# Patient Record
Sex: Female | Born: 1980 | Race: White | Hispanic: No | Marital: Married | Smoking: Former smoker
Health system: Southern US, Community
[De-identification: ages and names within clinical notes are randomized; demographics above are authoritative.]

## PROBLEM LIST (undated history)

## (undated) DIAGNOSIS — K219 Gastro-esophageal reflux disease without esophagitis: Secondary | ICD-10-CM

## (undated) DIAGNOSIS — Z1589 Genetic susceptibility to other disease: Secondary | ICD-10-CM

## (undated) HISTORY — PX: WISDOM TOOTH EXTRACTION: SHX21

## (undated) HISTORY — DX: Gastro-esophageal reflux disease without esophagitis: K21.9

---
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
## 2002-01-26 ENCOUNTER — Other Ambulatory Visit: Admission: RE | Admit: 2002-01-26 | Discharge: 2002-01-26 | Payer: Self-pay | Admitting: Obstetrics and Gynecology

## 2002-01-26 ENCOUNTER — Other Ambulatory Visit: Admission: RE | Admit: 2002-01-26 | Discharge: 2002-01-26 | Payer: Self-pay | Admitting: Obstetrics & Gynecology

## 2003-08-23 ENCOUNTER — Other Ambulatory Visit: Admission: RE | Admit: 2003-08-23 | Discharge: 2003-08-23 | Payer: Self-pay | Admitting: Obstetrics and Gynecology

## 2005-03-17 ENCOUNTER — Encounter: Admission: RE | Admit: 2005-03-17 | Discharge: 2005-03-17 | Payer: Self-pay | Admitting: Family Medicine

## 2005-07-16 ENCOUNTER — Other Ambulatory Visit: Admission: RE | Admit: 2005-07-16 | Discharge: 2005-07-16 | Payer: Self-pay | Admitting: Family Medicine

## 2006-06-04 ENCOUNTER — Encounter: Admission: RE | Admit: 2006-06-04 | Discharge: 2006-06-04 | Payer: Self-pay | Admitting: *Deleted

## 2007-12-18 ENCOUNTER — Inpatient Hospital Stay (HOSPITAL_COMMUNITY): Admission: AD | Admit: 2007-12-18 | Discharge: 2007-12-18 | Payer: Self-pay | Admitting: Obstetrics & Gynecology

## 2007-12-20 ENCOUNTER — Inpatient Hospital Stay (HOSPITAL_COMMUNITY): Admission: AD | Admit: 2007-12-20 | Discharge: 2007-12-20 | Payer: Self-pay | Admitting: Obstetrics and Gynecology

## 2007-12-21 ENCOUNTER — Inpatient Hospital Stay (HOSPITAL_COMMUNITY): Admission: AD | Admit: 2007-12-21 | Discharge: 2007-12-21 | Payer: Self-pay | Admitting: Obstetrics and Gynecology

## 2007-12-22 ENCOUNTER — Inpatient Hospital Stay (HOSPITAL_COMMUNITY): Admission: AD | Admit: 2007-12-22 | Discharge: 2007-12-25 | Payer: Self-pay | Admitting: Obstetrics & Gynecology

## 2008-02-01 IMAGING — US US ABDOMEN COMPLETE
1 series · 14 of 25 positions shown · non-contrast
Comparison: None.

ABDOMEN ULTRASOUND:

CLINICAL DATA: Nausea with right upper quadrant pain.
TECHNIQUE: Complete abdominal ultrasound examination was performed including
evaluation of the liver, gallbladder, bile ducts, pancreas, kidneys, spleen,
IVC, and abdominal aorta.

[Series 1: us abdomen complete · 0.26mm/px · 14 of 99 slices shown]
[im 1/99]
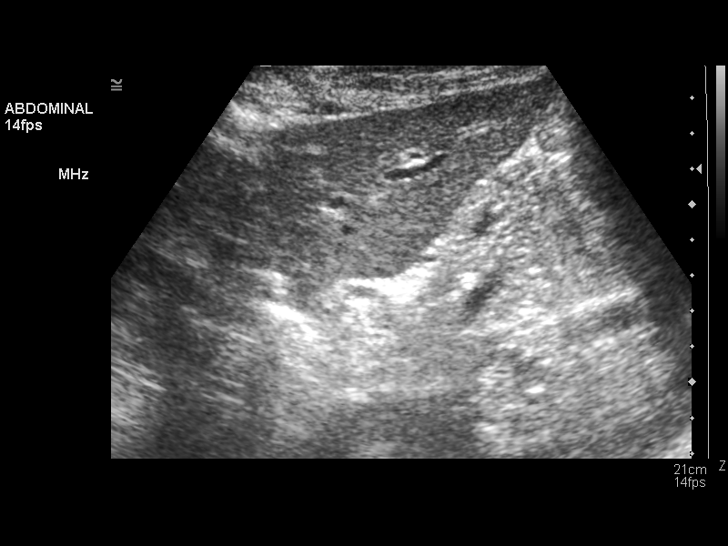
[im 9/99]
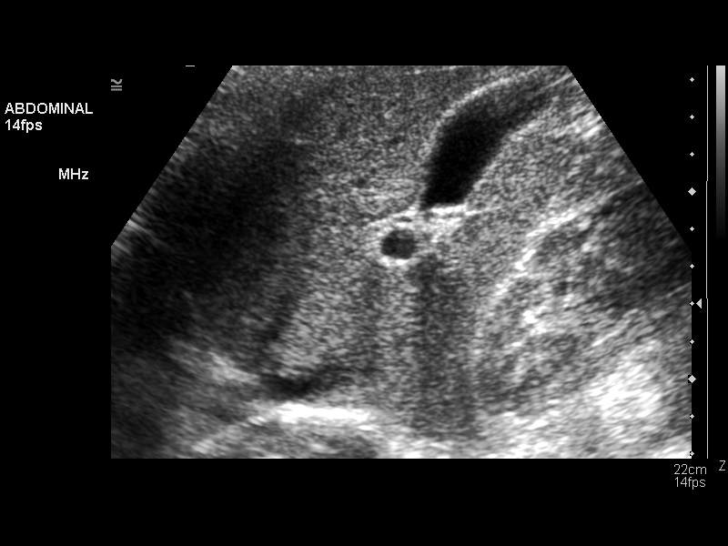
[im 17/99]
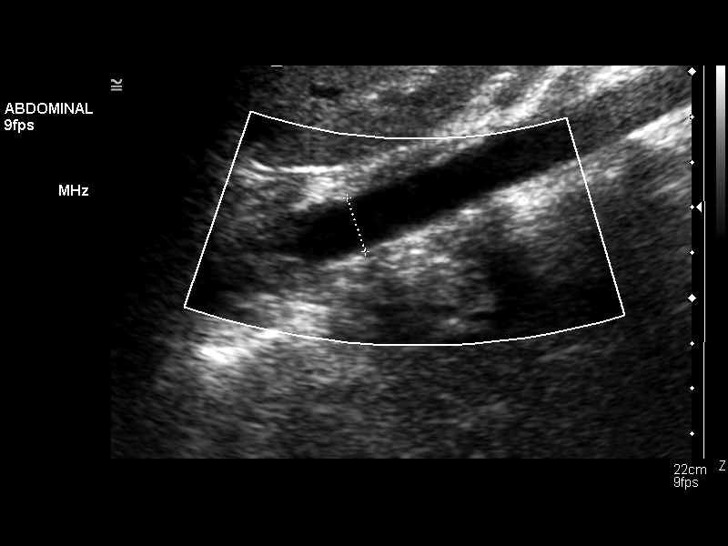
[im 25/99]
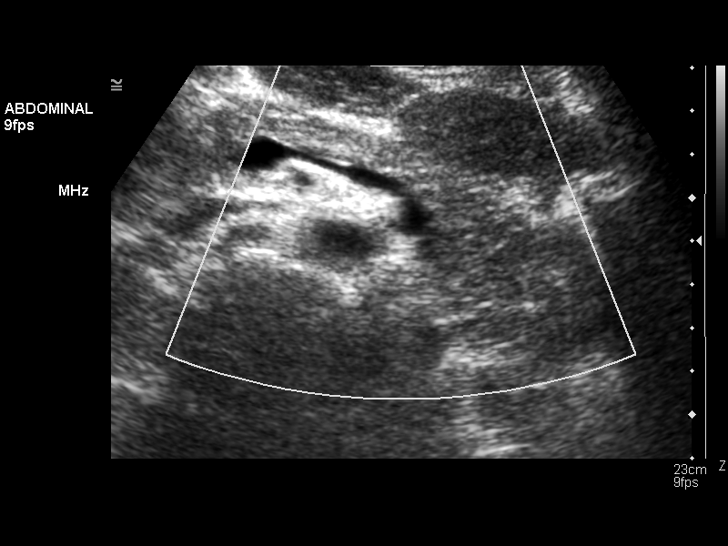
[im 33/99]
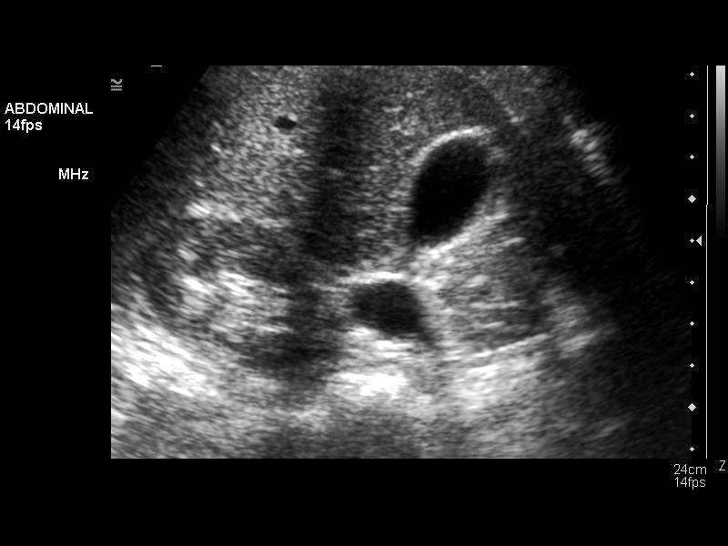
[im 37/99]
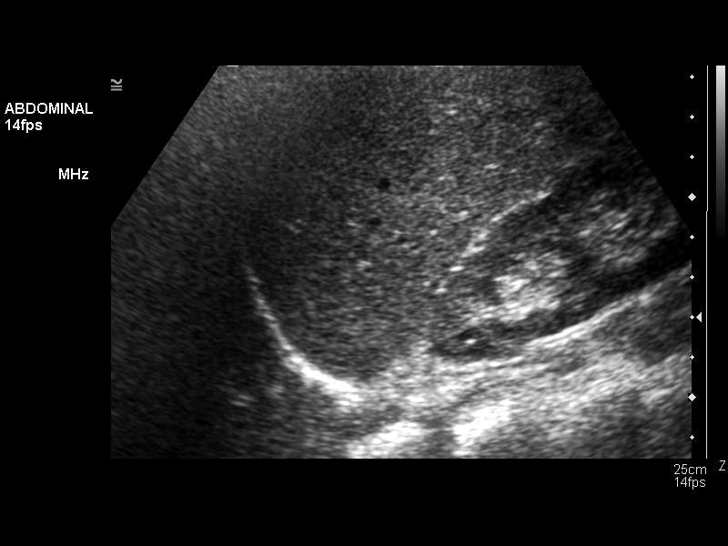
[im 45/99]
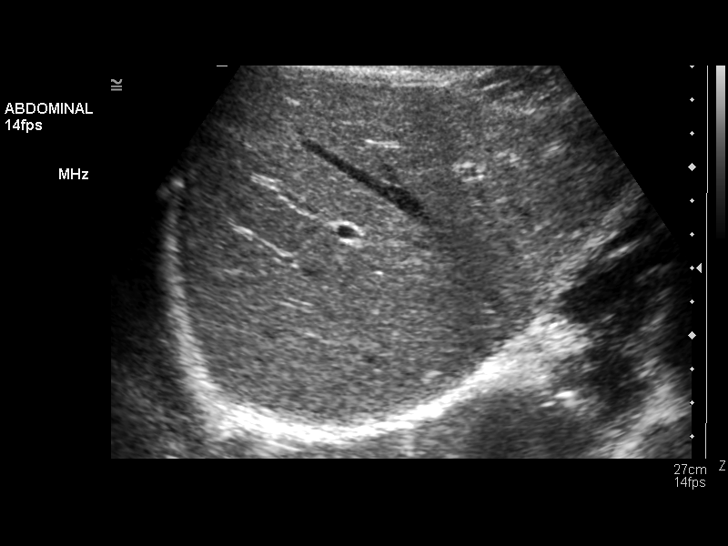
[im 54/99]
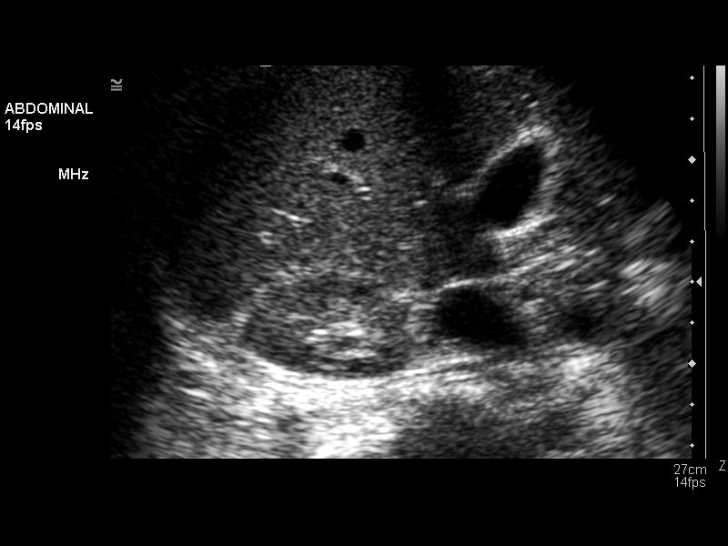
[im 62/99]
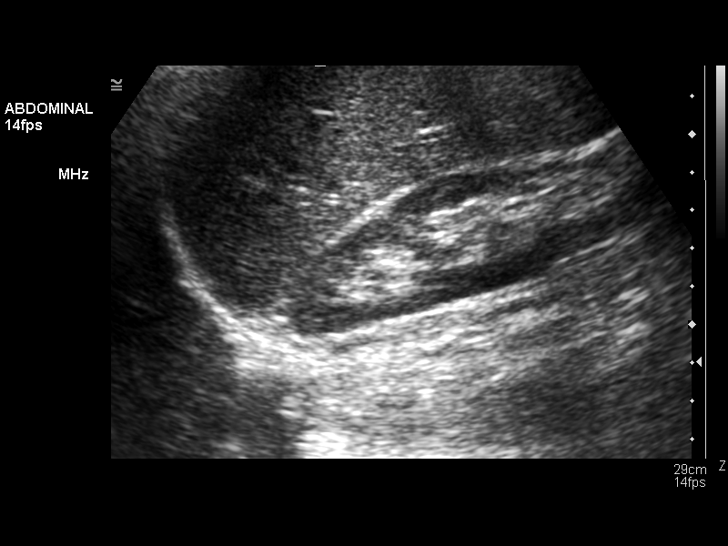
[im 66/99]
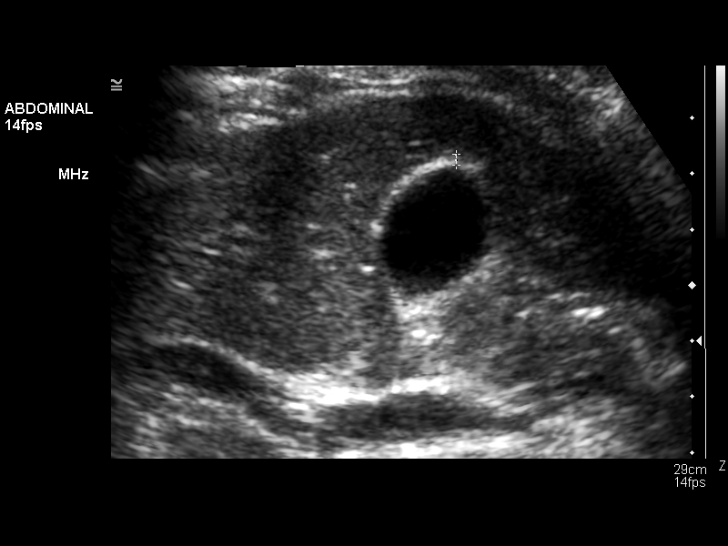
[im 74/99]
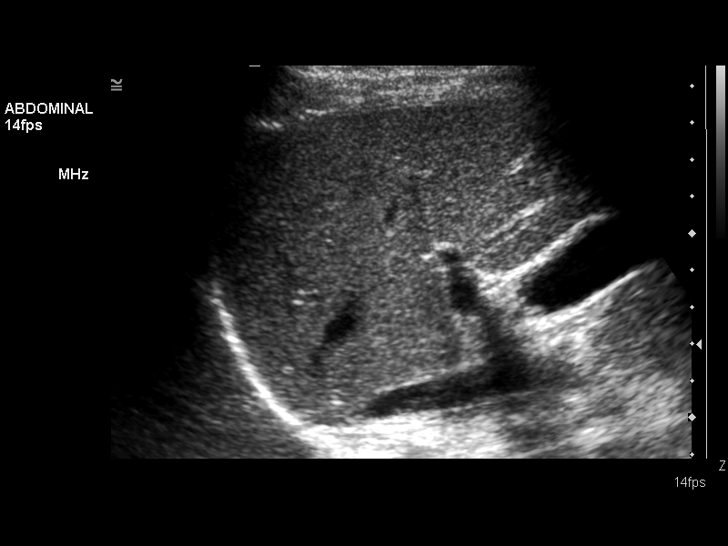
[im 82/99]
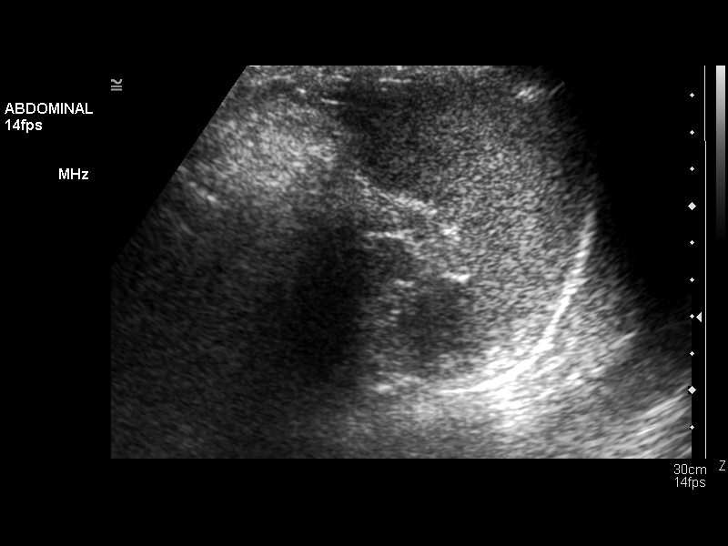
[im 90/99]
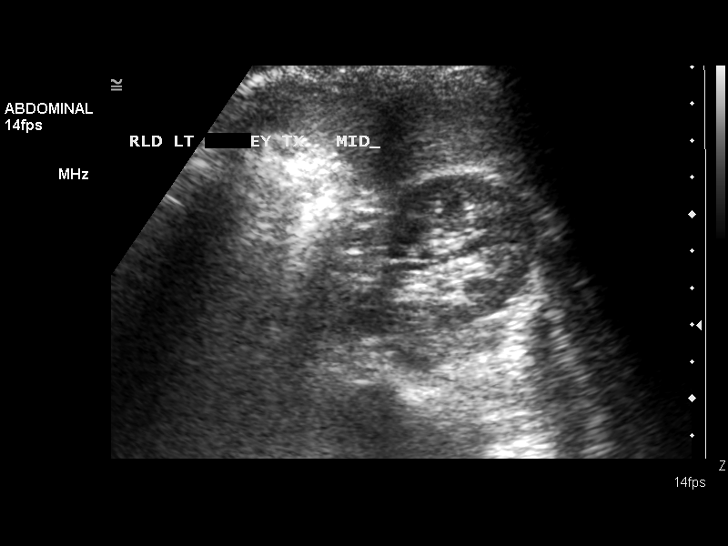
[im 99/99]
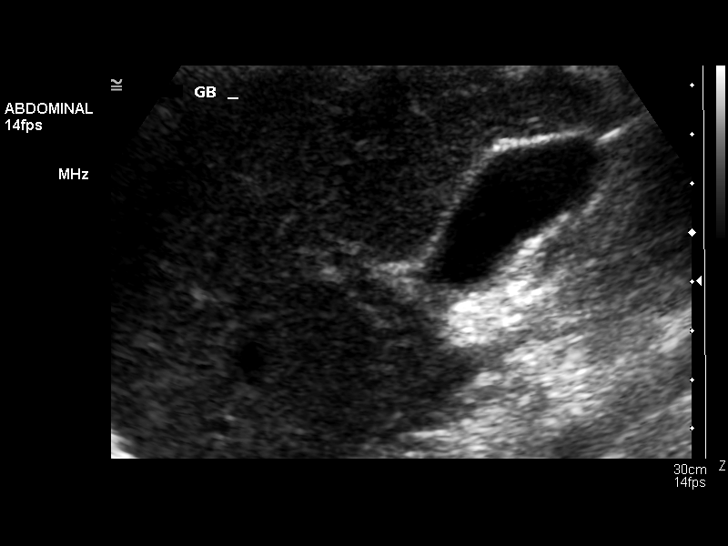

[14 of 25 positions shown; findings below may reference images not displayed]

FINDINGS: Gallbladder: Normal. Specifically, there is no evidence for gallstones,
gallbladder wall thickening or pericholecystic fluid.

Common Bile Duct:  Nondilated

Liver:  Normal

Inferior Vena Cava:  Normal

Pancreas:  Normal

Spleen:  Normal

Right Kidney:  11.4 cm in long axis.  Normal

Left Kidney:  9.8 cm in long axis.  Normal

Aorta:  No aneurysm
IMPRESSION: Normal abdominal ultrasound.

## 2010-05-01 ENCOUNTER — Inpatient Hospital Stay (HOSPITAL_COMMUNITY): Admission: AD | Admit: 2010-05-01 | Payer: Self-pay | Admitting: Obstetrics and Gynecology

## 2010-05-16 ENCOUNTER — Inpatient Hospital Stay (HOSPITAL_COMMUNITY)
Admission: RE | Admit: 2010-05-16 | Discharge: 2010-05-19 | Payer: Self-pay | Source: Home / Self Care | Attending: Obstetrics and Gynecology | Admitting: Obstetrics and Gynecology

## 2010-08-11 LAB — CBC
HCT: 28.7 % — ABNORMAL LOW (ref 36.0–46.0)
Hemoglobin: 9.5 g/dL — ABNORMAL LOW (ref 12.0–15.0)
MCH: 28.4 pg (ref 26.0–34.0)
MCHC: 33.1 g/dL (ref 30.0–36.0)
MCV: 85.7 fL (ref 78.0–100.0)
Platelets: 225 10*3/uL (ref 150–400)
RBC: 3.35 MIL/uL — ABNORMAL LOW (ref 3.87–5.11)
RDW: 13.6 % (ref 11.5–15.5)
WBC: 8.8 10*3/uL (ref 4.0–10.5)

## 2010-08-11 LAB — ABO/RH: ABO/RH(D): B POS

## 2010-08-11 LAB — TYPE AND SCREEN
ABO/RH(D): B POS
Antibody Screen: NEGATIVE

## 2010-08-12 LAB — CBC
HCT: 33.1 % — ABNORMAL LOW (ref 36.0–46.0)
Hemoglobin: 11.1 g/dL — ABNORMAL LOW (ref 12.0–15.0)
MCH: 29.9 pg (ref 26.0–34.0)
MCHC: 33.7 g/dL (ref 30.0–36.0)
MCV: 88.8 fL (ref 78.0–100.0)
Platelets: 313 10*3/uL (ref 150–400)
RBC: 3.73 MIL/uL — ABNORMAL LOW (ref 3.87–5.11)
RDW: 13.7 % (ref 11.5–15.5)
WBC: 7.6 10*3/uL (ref 4.0–10.5)

## 2010-08-12 LAB — RPR: RPR Ser Ql: NONREACTIVE

## 2010-08-12 LAB — SURGICAL PCR SCREEN
MRSA, PCR: NEGATIVE
Staphylococcus aureus: NEGATIVE

## 2011-02-27 LAB — CBC
HCT: 31 — ABNORMAL LOW
HCT: 36.7
Hemoglobin: 10.3 — ABNORMAL LOW
Hemoglobin: 12.4
MCHC: 33.4
MCHC: 33.7
MCV: 88.6
MCV: 89.2
Platelets: 255
Platelets: 379
RBC: 3.47 — ABNORMAL LOW
RBC: 4.14
RDW: 14.7
RDW: 14.8
WBC: 12 — ABNORMAL HIGH
WBC: 16.5 — ABNORMAL HIGH

## 2011-02-27 LAB — HEPATIC FUNCTION PANEL
ALT: 13
AST: 26
Albumin: 2.7 — ABNORMAL LOW
Alkaline Phosphatase: 136 — ABNORMAL HIGH
Bilirubin, Direct: 0.1
Indirect Bilirubin: 0.4
Total Bilirubin: 0.5
Total Protein: 6

## 2011-02-27 LAB — RPR: RPR Ser Ql: NONREACTIVE

## 2014-02-28 ENCOUNTER — Other Ambulatory Visit: Payer: Self-pay | Admitting: Obstetrics and Gynecology

## 2014-03-02 ENCOUNTER — Other Ambulatory Visit: Payer: Self-pay | Admitting: Obstetrics and Gynecology

## 2014-03-02 DIAGNOSIS — N6321 Unspecified lump in the left breast, upper outer quadrant: Secondary | ICD-10-CM

## 2014-03-09 ENCOUNTER — Ambulatory Visit
Admission: RE | Admit: 2014-03-09 | Discharge: 2014-03-09 | Disposition: A | Discharge status: Home / Self Care | Payer: Commercial Indemnity | Source: Ambulatory Visit | Attending: Obstetrics and Gynecology | Admitting: Obstetrics and Gynecology

## 2014-03-09 ENCOUNTER — Encounter (INDEPENDENT_AMBULATORY_CARE_PROVIDER_SITE_OTHER): Payer: Self-pay

## 2014-03-09 ENCOUNTER — Other Ambulatory Visit: Payer: Self-pay

## 2014-03-09 DIAGNOSIS — N6321 Unspecified lump in the left breast, upper outer quadrant: Secondary | ICD-10-CM

## 2016-09-18 LAB — BASIC METABOLIC PANEL
BUN: 10 (ref 4–21)
Creatinine: 0.8 (ref <=1.1)
Glucose: 77
Potassium: 3.9 (ref 3.4–5.3)
Sodium: 138 (ref 137–147)

## 2016-09-18 LAB — CBC AND DIFFERENTIAL
HCT: 39 (ref 36–46)
Hemoglobin: 13.4 (ref 12.0–16.0)
WBC: 5.3

## 2016-09-18 LAB — TSH: TSH: 1.93 (ref <=5.90)

## 2016-09-18 LAB — HEPATIC FUNCTION PANEL
ALT: 8 (ref 7–35)
AST: 12 — AB (ref 13–35)

## 2016-09-18 LAB — LIPID PANEL
Cholesterol: 152 (ref 0–200)
HDL: 51 (ref 35–70)
LDL Cholesterol: 101
Triglycerides: 47 (ref 40–160)

## 2016-09-24 ENCOUNTER — Telehealth: Payer: Self-pay | Admitting: Cardiovascular Disease

## 2016-09-24 NOTE — Telephone Encounter (Signed)
Received records from [facility] for appointment with Dr Duke Salvia on 10/09/16.  Records put with Dr Leonides Sake schedule for 10/09/16. lp

## 2016-10-09 ENCOUNTER — Ambulatory Visit: Payer: Commercial Indemnity | Admitting: Cardiovascular Disease

## 2017-10-11 ENCOUNTER — Ambulatory Visit (INDEPENDENT_AMBULATORY_CARE_PROVIDER_SITE_OTHER): Payer: Managed Care, Other (non HMO) | Admitting: Family Medicine

## 2017-10-11 ENCOUNTER — Encounter: Payer: Self-pay | Admitting: Family Medicine

## 2017-10-11 ENCOUNTER — Other Ambulatory Visit: Payer: Self-pay

## 2017-10-11 VITALS — BP 100/68 | HR 65 | Temp 97.6°F | Ht <= 58 in | Wt 104.0 lb

## 2017-10-11 DIAGNOSIS — R05 Cough: Secondary | ICD-10-CM

## 2017-10-11 DIAGNOSIS — J3089 Other allergic rhinitis: Secondary | ICD-10-CM

## 2017-10-11 DIAGNOSIS — R5383 Other fatigue: Secondary | ICD-10-CM | POA: Diagnosis not present

## 2017-10-11 DIAGNOSIS — Z72820 Sleep deprivation: Secondary | ICD-10-CM | POA: Diagnosis not present

## 2017-10-11 DIAGNOSIS — R053 Chronic cough: Secondary | ICD-10-CM

## 2017-10-11 NOTE — Progress Notes (Signed)
Subjective  CC:  Chief Complaint  Patient presents with  . Establish Care    Transfer from [place], [place], Physical at [place] last year, Sees Gyn   . Cough    Patient states she had a cough and has to clear her throat throughout the day   . Fatigue    Patient states she is tired all the time.     HPI: Victoria Mcintosh is a 37 y.o. female who presents to [facility] today to establish care with me as a new patient.   She has the following concerns or needs:  Chronic cough x 1-2 years. Never has had medical evaluation before. Wants to be sure she is ok. Reports daily increased clearing of her throat, some nasal congestion, some eye redness with itching and at time productive cough. occ hoarseness. No f/c/s or h/o asthma/copd or lung disease. No specific exposures although she reports a mold problem in her bathroom. Hasn't tried any meds. Prefer homeopathic remedies if possible.   Reports h/o sxs of gerd but none now. No n/v or abdominal pain  C/o fatigue; worsening. Always tired. Admits staying up too late and only getting aobut 6 hours of sleep /night; needs about 8. No insomnia. No sxs of low thyroid. Had hgb at GYN office last week and thinks it was normal. No h/o anemia. Having irregular cycles. No birth control. No h/o thyroid problems. Denies sxs of hyperglycemia. Nutrition is fair. Little exercise. Of note, she is seeing a grief counselor; her dad passed away in 07/23/22.   We updated and reviewed the patient's past history in detail and it is documented below.  There are no active problems to display for this patient.  Health Maintenance  Topic Date Due  . INFLUENZA VACCINE  12/30/2017  . TETANUS/TDAP  05/01/2020  . PAP SMEAR  10/05/2020  . HIV Screening  Completed    There is no immunization history on file for this patient. Current Meds  Medication Sig  . Ascorbic Acid (VITAMIN C) 250 MG CHEW Chew by mouth.  Marland Kitchen MAGNESIUM CARBONATE PO Take  by mouth.  . Multiple Vitamins-Minerals (MULTIVITAMIN WITH MINERALS) tablet Take 1 tablet by mouth daily.    Allergies: Patient has No Known Allergies. Past Medical History Patient  has no past medical history on file. Past Surgical History Patient  has a past surgical history that includes Cesarean section. Family History: Patient family history includes Healthy in her daughter and son. Social History:  Patient  reports that she quit smoking about 18 years ago. Her smoking use included cigarettes. She has a 0.50 pack-year smoking history. She has never used smokeless tobacco. She reports that she does not drink alcohol or use drugs.  Review of Systems: Constitutional: negative for fever or malaise Ophthalmic: negative for photophobia, double vision or loss of vision Cardiovascular: negative for chest pain, dyspnea on exertion, or new LE swelling Respiratory: negative for SOB or persistent cough Gastrointestinal: negative for abdominal pain, change in bowel habits or melena Genitourinary: negative for dysuria or gross hematuria Musculoskeletal: negative for new gait disturbance or muscular weakness Integumentary: negative for new or persistent rashes Neurological: negative for TIA or stroke symptoms Psychiatric: negative for SI or delusions Allergic/Immunologic: negative for hives  Patient Care Team    Relationship Specialty Notifications Start End  Willow Ora, MD PCP - General Family Medicine  10/11/17   Zelphia Cairo, MD Consulting Physician Obstetrics and Gynecology  10/11/17  Grant Fontana, MD Referring Physician Ophthalmology  10/11/17     Objective  Vitals: BP 100/68   Pulse 65   Temp 97.6 F (36.4 C)   Ht  (1.473 m)   Wt 104 lb (47.2 kg)   LMP 10/11/2017 (Exact Date)   BMI 21.74 kg/m  General:  Well developed, well nourished, no acute distress  Psych:  Alert and oriented,normal mood and affect HEENT:  Normocephalic, atraumatic, non-icteric sclera,  PERRL, oropharynx is without mass or exudate, supple neck without adenopathy, mass or thyromegaly Cardiovascular:  RRR without gallop, rub or murmur, nondisplaced PMI Respiratory:  Good breath sounds bilaterally, CTAB with normal respiratory effort Gastrointestinal: normal bowel sounds, soft, non-tender, no noted masses. No HSM MSK: no deformities, contusions. Joints are without erythema or swelling Skin:  Warm, no rashes or suspicious lesions noted Neurologic:    Mental status is normal. Gross motor and sensory exams are normal. Normal gait  Assessment  1. Chronic cough   2. Non-seasonal allergic rhinitis, unspecified trigger   3. Other fatigue   4. Sleep deprivation      Plan   Chronic cough: discussed most likely cause is AR; consider GERD. To start oral antihistamine trial and/or flonase. Recheck in 2-4 weeks and start further evaluation if persisting. Discussed working on mold issue in home as this can exacerbate allergies. No red flags identified right now.   Fatigue: likely related to sleep deprivation and allergies; will work on getting more rest, good nutrition and return for f/u. Will get labs at f/u to ensure no other cause.   Follow up:  2-4 weeks for recheck and cpe.   Commons side effects, risks, benefits, and alternatives for medications and treatment plan prescribed today were discussed, and the patient expressed understanding of the given instructions. Patient is instructed to call or message via MyChart if he/she has any questions or concerns regarding our treatment plan. No barriers to understanding were identified. We discussed Red Flag symptoms and signs in detail. Patient expressed understanding regarding what to do in case of urgent or emergency type symptoms.   Medication list was reconciled, printed and provided to the patient in AVS. Patient instructions and summary information was reviewed with the patient as documented in the AVS. This note was prepared with  assistance of Dragon voice recognition software. Occasional wrong-word or sound-a-like substitutions may have occurred due to the inherent limitations of voice recognition software  No orders of the defined types were placed in this encounter.  No orders of the defined types were placed in this encounter.

## 2017-10-11 NOTE — Patient Instructions (Signed)
Please return in 2-4 weeks for for your annual complete physical; please come fasting.  Start zyrtec  nightly to see if your cough and fatigue improve. You can also try flonase nasal spray daily if you'd like.   Shoot for getting 8 hours of sleep per night.   It was a pleasure meeting you today! Thank you for choosing [place] to meet your healthcare needs! I truly look forward to working with you. If you have any questions or concerns, please send me a message via Mychart or call the office at [phone].

## 2017-10-19 ENCOUNTER — Encounter: Payer: Self-pay | Admitting: Emergency Medicine

## 2017-11-03 ENCOUNTER — Ambulatory Visit (INDEPENDENT_AMBULATORY_CARE_PROVIDER_SITE_OTHER): Payer: Managed Care, Other (non HMO) | Admitting: Family Medicine

## 2017-11-03 ENCOUNTER — Other Ambulatory Visit: Payer: Self-pay

## 2017-11-03 ENCOUNTER — Encounter: Payer: Self-pay | Admitting: Family Medicine

## 2017-11-03 VITALS — BP 110/78 | HR 73 | Temp 97.9°F | Resp 16 | Ht <= 58 in | Wt 104.0 lb

## 2017-11-03 DIAGNOSIS — Z0000 Encounter for general adult medical examination without abnormal findings: Secondary | ICD-10-CM | POA: Diagnosis not present

## 2017-11-03 DIAGNOSIS — R05 Cough: Secondary | ICD-10-CM

## 2017-11-03 DIAGNOSIS — R053 Chronic cough: Secondary | ICD-10-CM

## 2017-11-03 LAB — CBC WITH DIFFERENTIAL/PLATELET
Basophils Absolute: 0 10*3/uL (ref 0.0–0.1)
Basophils Relative: 0.9 % (ref 0.0–3.0)
Eosinophils Absolute: 0.1 10*3/uL (ref 0.0–0.7)
Eosinophils Relative: 2.4 % (ref 0.0–5.0)
HCT: 39.3 % (ref 36.0–46.0)
Hemoglobin: 13.6 g/dL (ref 12.0–15.0)
Lymphocytes Relative: 30.9 % (ref 12.0–46.0)
Lymphs Abs: 1.7 10*3/uL (ref 0.7–4.0)
MCHC: 34.5 g/dL (ref 30.0–36.0)
MCV: 90.9 fl (ref 78.0–100.0)
Monocytes Absolute: 0.5 10*3/uL (ref 0.1–1.0)
Monocytes Relative: 8.3 % (ref 3.0–12.0)
Neutro Abs: 3.2 10*3/uL (ref 1.4–7.7)
Neutrophils Relative %: 57.5 % (ref 43.0–77.0)
Platelets: 283 10*3/uL (ref 150.0–400.0)
RBC: 4.32 Mil/uL (ref 3.87–5.11)
RDW: 12.7 % (ref 11.5–15.5)
WBC: 5.6 10*3/uL (ref 4.0–10.5)

## 2017-11-03 LAB — COMPREHENSIVE METABOLIC PANEL
ALT: 12 U/L (ref 0–35)
AST: 13 U/L (ref 0–37)
Albumin: 4.5 g/dL (ref 3.5–5.2)
Alkaline Phosphatase: 27 U/L — ABNORMAL LOW (ref 39–117)
BUN: 11 mg/dL (ref 6–23)
CO2: 27 mEq/L (ref 19–32)
Calcium: 9.4 mg/dL (ref 8.4–10.5)
Chloride: 103 mEq/L (ref 96–112)
Creatinine, Ser: 0.8 mg/dL (ref 0.40–1.20)
GFR: 85.54 mL/min (ref >60.00)
Glucose, Bld: 90 mg/dL (ref 70–99)
Potassium: 4 mEq/L (ref 3.5–5.1)
Sodium: 138 mEq/L (ref 135–145)
Total Bilirubin: 0.8 mg/dL (ref 0.2–1.2)
Total Protein: 6.7 g/dL (ref 6.0–8.3)

## 2017-11-03 LAB — LIPID PANEL
Cholesterol: 157 mg/dL (ref 0–200)
HDL: 58.3 mg/dL (ref >39.00)
LDL Cholesterol: 85 mg/dL (ref 0–99)
NonHDL: 98.59
Total CHOL/HDL Ratio: 3
Triglycerides: 68 mg/dL (ref 0.0–149.0)
VLDL: 13.6 mg/dL (ref 0.0–40.0)

## 2017-11-03 LAB — VITAMIN D 25 HYDROXY (VIT D DEFICIENCY, FRACTURES): VITD: 41.94 ng/mL (ref 30.00–100.00)

## 2017-11-03 LAB — TSH: TSH: 3.5 u[IU]/mL (ref 0.35–4.50)

## 2017-11-03 MED ORDER — FLUTICASONE PROPIONATE 50 MCG/ACT NA SUSP: 1.0000 | Freq: Every day | NASAL | 5 refills | Status: DC

## 2017-11-03 NOTE — Progress Notes (Signed)
Subjective  Chief Complaint  Patient presents with  . Annual Exam    Last CPE 1-2 years ago, wants a lipid panel and vit D checked, CMP, CBC, Diff, Plt    HPI: Victoria Mcintosh is a 37 y.o. female who presents to [facility] today for a Female Wellness Visit. Had GYN female wellness exam last month.  Wellness Visit: annual visit with health maintenance review and exam without Pap   37 yo healthy. See last note: had chronic cough most likely due to AR: started Zyrtec and is no longer coughing. Hasn't tried flonase. Still with some nasal congestion and PND but this doesn't bother her much. She does endorse intermittent mouth breathing. Still with some fatigue but not going to bed till 12:30am on most nights. Of note, her father, Jeani Hawking, who passed in February was my former patient. She found my name on one of his RX bottles.   HM: up to date.  Lifestyle: Body mass index is 22.12 kg/m. Wt Readings from Last 3 Encounters:  11/03/17 104 lb (47.2 kg)  10/11/17 104 lb (47.2 kg)   Diet: general Exercise: intermittently,  Need for contraception: No,   Patient Active Problem List   Diagnosis Date Noted  . Chronic cough 10/11/2017   Health Maintenance  Topic Date Due  . INFLUENZA VACCINE  12/30/2017  . TETANUS/TDAP  05/01/2020  . PAP SMEAR  10/05/2020  . HIV Screening  Completed    There is no immunization history on file for this patient. We updated and reviewed the patient's past history in detail and it is documented below. Allergies: Patient has No Known Allergies. Past Medical History Patient  has a past medical history of GERD (gastroesophageal reflux disease). Past Surgical History Patient  has a past surgical history that includes Cesarean section. Family History: Patient family history includes Arthritis in her maternal grandmother; Cancer in her paternal grandfather; Diabetes in her maternal grandmother; Healthy in her daughter; Heart  attack in her father; Heart disease in her father; Hyperlipidemia in her father and mother; Hypertension in her father and mother; Learning disabilities in her son; Miscarriages / [place] in her mother. Social History:  Patient  reports that she quit smoking about 18 years ago. Her smoking use included cigarettes. She has a 0.50 pack-year smoking history. She has never used smokeless tobacco. She reports that she does not drink alcohol or use drugs.  Review of Systems: Constitutional: negative for fever or malaise Ophthalmic: negative for photophobia, double vision or loss of vision Cardiovascular: negative for chest pain, dyspnea on exertion, or new LE swelling Respiratory: negative for SOB or persistent cough Gastrointestinal: negative for abdominal pain, change in bowel habits or melena Genitourinary: negative for dysuria or gross hematuria, no abnormal uterine bleeding or disharge Musculoskeletal: negative for new gait disturbance or muscular weakness Integumentary: negative for new or persistent rashes, no breast lumps Neurological: negative for TIA or stroke symptoms Psychiatric: negative for SI or delusions Allergic/Immunologic: negative for hives Patient Care Team    Relationship Specialty Notifications Start End  Willow Ora, MD PCP - General Family Medicine  10/11/17   Zelphia Cairo, MD Consulting Physician Obstetrics and Gynecology  10/11/17   Grant Fontana, MD Referring Physician Ophthalmology  10/11/17     Objective  Vitals: BP 110/78   Pulse 73   Temp 97.9 F (36.6 C) (Oral)   Resp 16   Ht 4' 9.5" (1.461 m)   Wt 104 lb (47.2  kg)   LMP 10/11/2017 (Exact Date)   SpO2 99%   BMI 22.12 kg/m  General:  Well developed, well nourished, no acute distress  Psych:  Alert and orientedx3,normal mood and affect HEENT:  Normocephalic, atraumatic, non-icteric sclera, PERRL, oropharynx is clear without mass or exudate, + PND, nasal mucosa with inflammation and congestion,  clear; supple neck without adenopathy, mass or thyromegaly Cardiovascular:  Normal S1, S2, RRR without gallop, rub or murmur, nondisplaced PMI Respiratory:  Good breath sounds bilaterally, CTAB with normal respiratory effort Gastrointestinal: normal bowel sounds, soft, non-tender, no noted masses. No HSM MSK: no deformities, contusions. Joints are without erythema or swelling. Spine and CVA region are nontender Skin:  Warm, no rashes or suspicious lesions noted Neurologic:    Mental status is normal. CN 2-11 are normal. Gross motor and sensory exams are normal. Normal gait. No tremor    Assessment  1. Annual physical exam   2. Chronic cough      Plan  Female Wellness Visit:  Age appropriate Health Maintenance and Prevention measures were discussed with patient. Included topics are cancer screening recommendations, ways to keep healthy (see AVS) including dietary and exercise recommendations, regular eye and dental care, use of seat belts, and avoidance of moderate alcohol use and tobacco use.   BMI: discussed patient's BMI and encouraged positive lifestyle modifications to help get to or maintain a target BMI.  HM needs and immunizations were addressed and ordered. See below for orders. See HM and immunization section for updates.  Routine labs and screening tests ordered including cmp, cbc and lipids where appropriate.  Discussed recommendations regarding Vit D and calcium supplementation (see AVS)  AR: continue zyrtec and add flonase  Follow up: Return in about 1 year (around 11/04/2018) for complete physical.    Commons side effects, risks, benefits, and alternatives for medications and treatment plan prescribed today were discussed, and the patient expressed understanding of the given instructions. Patient is instructed to call or message via MyChart if he/she has any questions or concerns regarding our treatment plan. No barriers to understanding were identified. We discussed Red  Flag symptoms and signs in detail. Patient expressed understanding regarding what to do in case of urgent or emergency type symptoms.   Medication list was reconciled, printed and provided to the patient in AVS. Patient instructions and summary information was reviewed with the patient as documented in the AVS. This note was prepared with assistance of Dragon voice recognition software. Occasional wrong-word or sound-a-like substitutions may have occurred due to the inherent limitations of voice recognition software  Orders Placed This Encounter  Procedures  . TSH  . CBC with Differential/Platelet  . Comprehensive metabolic panel  . Lipid panel  . VITAMIN D 25 Hydroxy (Vit-D Deficiency, Fractures)   Meds ordered this encounter  Medications  . fluticasone (FLONASE) 50 MCG/ACT nasal spray    Sig: Place 1 spray into both nostrils daily.    Dispense:  16 g    Refill:  5

## 2017-11-03 NOTE — Patient Instructions (Addendum)
Please return in 1 year for your physical.    If you have any questions or concerns, please don't hesitate to send me a message via MyChart or call the office at [phone]. Thank you for visiting with us today! It's our pleasure caring for you.  I will release your lab results to you on your MyChart account with further instructions. Please reply with any questions.  Please do these things to maintain good health!   Exercise at least 30-45 minutes a day,  4-5 days a week.   Eat a low-fat diet with lots of fruits and vegetables, up to 7-9 servings per day.  Drink plenty of water daily. Try to drink 8 8oz glasses per day.  Seatbelts can save your life. Always wear your seatbelt.  Place Smoke Detectors on every level of your home and check batteries every year.  Schedule an appointment with an eye doctor for an eye exam every 1-2 years  Safe sex - use condoms to protect yourself from STDs if you could be exposed to these types of infections. Use birth control if you do not want to become pregnant and are sexually active.  Avoid heavy alcohol use. If you drink, keep it to less than 2 drinks/day and not every day.  Health Care Power of Attorney.  Choose someone you trust that could speak for you if you became unable to speak for yourself.  Depression is common in our stressful world.If you're feeling down or losing interest in things you normally enjoy, please come in for a visit.  If anyone is threatening or hurting you, please get help. Physical or Emotional Violence is never OK.

## 2017-11-04 ENCOUNTER — Encounter: Payer: Self-pay | Admitting: Family Medicine

## 2017-11-04 NOTE — Progress Notes (Signed)
Lab results mailed to patient in letter. Normal results. No action / follow up needed on these results.  

## 2017-11-04 NOTE — Progress Notes (Signed)
I have reviewed results. Normal. Patient notified by letter. Please see letter for details. 

## 2018-11-07 ENCOUNTER — Ambulatory Visit (INDEPENDENT_AMBULATORY_CARE_PROVIDER_SITE_OTHER): Payer: Managed Care, Other (non HMO) | Admitting: Family Medicine

## 2018-11-07 ENCOUNTER — Other Ambulatory Visit: Payer: Self-pay

## 2018-11-07 ENCOUNTER — Encounter: Payer: Self-pay | Admitting: Family Medicine

## 2018-11-07 VITALS — BP 114/72 | HR 83 | Temp 98.3°F | Resp 14 | Ht <= 58 in | Wt 110.2 lb

## 2018-11-07 DIAGNOSIS — R202 Paresthesia of skin: Secondary | ICD-10-CM

## 2018-11-07 DIAGNOSIS — J309 Allergic rhinitis, unspecified: Secondary | ICD-10-CM | POA: Insufficient documentation

## 2018-11-07 DIAGNOSIS — Z0000 Encounter for general adult medical examination without abnormal findings: Secondary | ICD-10-CM

## 2018-11-07 DIAGNOSIS — F419 Anxiety disorder, unspecified: Secondary | ICD-10-CM | POA: Diagnosis not present

## 2018-11-07 DIAGNOSIS — N6011 Diffuse cystic mastopathy of right breast: Secondary | ICD-10-CM

## 2018-11-07 DIAGNOSIS — N6012 Diffuse cystic mastopathy of left breast: Secondary | ICD-10-CM

## 2018-11-07 LAB — TSH: TSH: 2.54 u[IU]/mL (ref 0.35–4.50)

## 2018-11-07 LAB — CBC WITH DIFFERENTIAL/PLATELET
Basophils Absolute: 0.1 10*3/uL (ref 0.0–0.1)
Basophils Relative: 1 % (ref 0.0–3.0)
Eosinophils Absolute: 0.3 10*3/uL (ref 0.0–0.7)
Eosinophils Relative: 4.3 % (ref 0.0–5.0)
HCT: 41.3 % (ref 36.0–46.0)
Hemoglobin: 14.2 g/dL (ref 12.0–15.0)
Lymphocytes Relative: 27.2 % (ref 12.0–46.0)
Lymphs Abs: 1.6 10*3/uL (ref 0.7–4.0)
MCHC: 34.4 g/dL (ref 30.0–36.0)
MCV: 92.2 fl (ref 78.0–100.0)
Monocytes Absolute: 0.5 10*3/uL (ref 0.1–1.0)
Monocytes Relative: 8.7 % (ref 3.0–12.0)
Neutro Abs: 3.5 10*3/uL (ref 1.4–7.7)
Neutrophils Relative %: 58.8 % (ref 43.0–77.0)
Platelets: 312 10*3/uL (ref 150.0–400.0)
RBC: 4.48 Mil/uL (ref 3.87–5.11)
RDW: 12.1 % (ref 11.5–15.5)
WBC: 6 10*3/uL (ref 4.0–10.5)

## 2018-11-07 LAB — LIPID PANEL
Cholesterol: 161 mg/dL (ref 0–200)
HDL: 60.2 mg/dL (ref >39.00)
LDL Cholesterol: 81 mg/dL (ref 0–99)
NonHDL: 101.24
Total CHOL/HDL Ratio: 3
Triglycerides: 101 mg/dL (ref 0.0–149.0)
VLDL: 20.2 mg/dL (ref 0.0–40.0)

## 2018-11-07 LAB — COMPREHENSIVE METABOLIC PANEL
ALT: 11 U/L (ref 0–35)
AST: 13 U/L (ref 0–37)
Albumin: 4.5 g/dL (ref 3.5–5.2)
Alkaline Phosphatase: 32 U/L — ABNORMAL LOW (ref 39–117)
BUN: 11 mg/dL (ref 6–23)
CO2: 28 mEq/L (ref 19–32)
Calcium: 9.2 mg/dL (ref 8.4–10.5)
Chloride: 103 mEq/L (ref 96–112)
Creatinine, Ser: 0.81 mg/dL (ref 0.40–1.20)
GFR: 78.91 mL/min (ref >60.00)
Glucose, Bld: 72 mg/dL (ref 70–99)
Potassium: 3.6 mEq/L (ref 3.5–5.1)
Sodium: 138 mEq/L (ref 135–145)
Total Bilirubin: 0.5 mg/dL (ref 0.2–1.2)
Total Protein: 6.7 g/dL (ref 6.0–8.3)

## 2018-11-07 LAB — B12 AND FOLATE PANEL
Folate: >23.9 ng/mL (ref >5.9)
Vitamin B-12: 385 pg/mL (ref 211–911)

## 2018-11-07 MED ORDER — FLUTICASONE PROPIONATE 50 MCG/ACT NA SUSP: 2.0000 | Freq: Every day | NASAL | 6 refills | Status: DC

## 2018-11-07 NOTE — Progress Notes (Signed)
Subjective  Chief Complaint  Patient presents with  . Annual Exam    Not fasting  . Breast Pain    Left side feels achy, has some pain not sure if related to heart but on left side  . Numbness    Bilateral feet started about a month ago    HPI: Victoria Mcintosh is a 38 y.o. female who presents to [facility] [place] today for a Female Wellness Visit.  She also has the concerns and/or needs as listed above in the chief complaint. These will be addressed in addition to the Health Maintenance Visit.   Wellness Visit: annual visit with health maintenance review and exam without Pap   HM: pap smear up to date. Healthy lifestyle  Chronic disease management visit and/or acute problem visit:  C/o bilateral breast tenderness on and off for several months now. No lumps or masses. No nipple discharge. Regular menses. No birth control (would be fine with another child).   Left chest pressure with tingling in hands ands feet at times. Feels tired. Endorses increase anxiety and worry. No panic attacks. Reports typically is an uptight person. No h/o mood problems or GAD. Sleeps well. Normal appetite.   Allergies: persist. Tried zyrtec but prefers to not take medications so stopped. Reports nasal congestion that is bothersome. No wheeze. No eye sxs.   [facility] 11/07/2018 10/11/2017  Decreased Interest 0 0  Down, Depressed, Hopeless 0 0  PHQ - 2 Score 0 0  Altered sleeping - 0  Tired, decreased energy - 0  Change in appetite - 0  Feeling bad or failure about yourself  - 0  Trouble concentrating - 0  Moving slowly or fidgety/restless - 0  Suicidal thoughts - 0  PHQ-9 Score - 0  Difficult doing work/chores - Not difficult at all    Assessment  1. Annual physical exam   2. Paresthesia   3. Anxiety   4. Chronic allergic rhinitis   5. Fibrocystic breast changes of both breasts      Plan  Female Wellness Visit:  Age appropriate Health Maintenance and  Prevention measures were discussed with patient. Included topics are cancer screening recommendations, ways to keep healthy (see AVS) including dietary and exercise recommendations, regular eye and dental care, use of seat belts, and avoidance of moderate alcohol use and tobacco use.   BMI: discussed patient's BMI and encouraged positive lifestyle modifications to help get to or maintain a target BMI.  HM needs and immunizations were addressed and ordered. See below for orders. See HM and immunization section for updates.  Routine labs and screening tests ordered including cmp, cbc and lipids where appropriate.  Discussed recommendations regarding Vit D and calcium supplementation (see AVS)  Chronic disease f/u and/or acute problem visit: (deemed necessary to be done in addition to the wellness visit):  anxiety:  sxs most c/w anxiety. Education started. See avs for information and behavioral mgt. Recheck in 3-4 weeks and rec SSRI at that time if not improving. Check labs for anemia, or vitamin deficiency as well. Reassured. No sxs of CAD currently. Low risk pt  AR: rec flonase trial.   Fibrocystic breast changes. Educated. Start vit E bid.   Follow up: Return in 4 weeks (on 12/05/2018) for  recheck anxiety.   Orders Placed This Encounter  Procedures  . CBC with Differential  . Comprehensive metabolic panel  . Lipid panel  . TSH  . B12 and Folate Panel  Meds ordered this encounter  Medications  . fluticasone (FLONASE) 50 MCG/ACT nasal spray    Sig: Place 2 sprays into both nostrils daily.    Dispense:  16 g    Refill:  6      Lifestyle: Body mass index is 23.43 kg/m. Wt Readings from Last 3 Encounters:  11/07/18 110 lb 3.2 oz (50 kg)  11/03/17 104 lb (47.2 kg)  10/11/17 104 lb (47.2 kg)   Diet: general Exercise: intermittently,  Need for contraception: No, none  Patient Active Problem List   Diagnosis Date Noted  . Chronic allergic rhinitis 11/07/2018  . Chronic  cough 10/11/2017   Health Maintenance  Topic Date Due  . INFLUENZA VACCINE  12/31/2018  . TETANUS/TDAP  05/01/2020  . PAP SMEAR-Modifier  10/05/2020  . HIV Screening  Completed    There is no immunization history on file for this patient. We updated and reviewed the patient's past history in detail and it is documented below. Allergies: Patient  reports no history of alcohol use. Past Medical History Patient  has a past medical history of GERD (gastroesophageal reflux disease). Past Surgical History Patient  has a past surgical history that includes Cesarean section. Social History   Socioeconomic History  . Marital status: Married    Spouse name: Not on file  . Number of children: 2  . Years of education: Not on file  . Highest education level: Not on file  Occupational History  . Occupation: [employer]; home schools  Social Needs  . Financial resource strain: Not on file  . Food insecurity:    Worry: Not on file    Inability: Not on file  . Transportation needs:    Medical: Not on file    Non-medical: Not on file  Tobacco Use  . Smoking status: Former Smoker    Packs/day: 0.25    Years: 2.00    Pack years: 0.50    Types: Cigarettes    Last attempt to quit: 03/1999    Years since quitting: 19.6  . Smokeless tobacco: Never Used  Substance and Sexual Activity  . Alcohol use: Never    Frequency: Never  . Drug use: Never  . Sexual activity: Yes    Birth control/protection: None  Lifestyle  . Physical activity:    Days per week: Not on file    Minutes per session: Not on file  . Stress: Not on file  Relationships  . Social connections:    Talks on phone: Not on file    Gets together: Not on file    Attends religious service: Not on file    Active member of club or organization: Not on file    Attends meetings of clubs or organizations: Not on file    Relationship status: Not on file  Other Topics Concern  . Not on file  Social History Narrative  .  Not on file   Family History  Problem Relation Age of Onset  . Hyperlipidemia Mother   . Hypertension Mother   . Miscarriages / [place] Mother   . Heart attack Father   . Heart disease Father   . Hyperlipidemia Father   . Hypertension Father   . Healthy Daughter   . Learning disabilities Son   . Arthritis Maternal Grandmother   . Diabetes Maternal Grandmother   . Cancer Paternal Grandfather     Review of Systems: Constitutional: negative for fever or malaise Ophthalmic: negative for photophobia, double vision or loss  of vision Cardiovascular: negative for chest pain, dyspnea on exertion, or new LE swelling Respiratory: negative for SOB or persistent cough Gastrointestinal: negative for abdominal pain, change in bowel habits or melena Genitourinary: negative for dysuria or gross hematuria, no abnormal uterine bleeding or disharge Musculoskeletal: negative for new gait disturbance or muscular weakness Integumentary: negative for new or persistent rashes, no breast lumps Neurological: negative for TIA or stroke symptoms Psychiatric: negative for SI or delusions Allergic/Immunologic: negative for hives  Patient Care Team    Relationship Specialty Notifications Start End  Leamon Arnt, MD PCP - General Family Medicine  10/11/17   Marylynn Pearson, MD Consulting Physician Obstetrics and Gynecology  10/11/17   Konrad Felix, MD Referring Physician Ophthalmology  10/11/17     Objective  Vitals: BP 114/72   Pulse 83   Temp 98.3 F (36.8 C) (Oral)   Resp 14   Ht 4' 9.5" (1.461 m)   Wt 110 lb 3.2 oz (50 kg)   LMP 10/21/2018   SpO2 99%   BMI 23.43 kg/m  General:  Well developed, well nourished, no acute distress  Psych:  Alert and orientedx3,normal mood and affect HEENT:  Normocephalic, atraumatic, non-icteric sclera, PERRL, oropharynx is clear without mass or exudate, supple neck without adenopathy, mass or thyromegaly Cardiovascular:  Normal S1, S2, RRR without gallop,  rub or murmur, nondisplaced PMI Respiratory:  Good breath sounds bilaterally, CTAB with normal respiratory effort Gastrointestinal: normal bowel sounds, soft, non-tender, no noted masses. No HSM MSK: no deformities, contusions. Joints are without erythema or swelling. Spine and CVA region are nontender Skin:  Warm, no rashes or suspicious lesions noted Neurologic:    Mental status is normal. CN 2-11 are normal. Gross motor and sensory exams are normal. Normal gait. No tremor Breast Exam: No mass, skin retraction or nipple discharge is appreciated in either breast. No axillary adenopathy. Fibrocystic changes are noted     Commons side effects, risks, benefits, and alternatives for medications and treatment plan prescribed today were discussed, and the patient expressed understanding of the given instructions. Patient is instructed to call or message via MyChart if he/she has any questions or concerns regarding our treatment plan. No barriers to understanding were identified. We discussed Red Flag symptoms and signs in detail. Patient expressed understanding regarding what to do in case of urgent or emergency type symptoms.   Medication list was reconciled, printed and provided to the patient in AVS. Patient instructions and summary information was reviewed with the patient as documented in the AVS. This note was prepared with assistance of Dragon voice recognition software. Occasional wrong-word or sound-a-like substitutions may have occurred due to the inherent limitations of voice recognition software

## 2018-11-07 NOTE — Patient Instructions (Addendum)
Please return in 4 weeks to discuss stress and anxiety.    If you have any questions or concerns, please don't hesitate to send me a message via MyChart or call the office at [phone]. Thank you for visiting with [place] today! It's our pleasure caring for you.   Health Maintenance, Female Adopting a healthy lifestyle and getting preventive care can go a long way to promote health and wellness. Talk with your health care provider about what schedule of regular examinations is right for you. This is a good chance for you to check in with your provider about disease prevention and staying healthy. In between checkups, there are plenty of things you can do on your own. Experts have done a lot of research about which lifestyle changes and preventive measures are most likely to keep you healthy. Ask your health care provider for more information. Weight and diet Eat a healthy diet  Be sure to include plenty of vegetables, fruits, low-fat dairy products, and lean protein.  Do not eat a lot of foods high in solid fats, added sugars, or salt.  Get regular exercise. This is one of the most important things you can do for your health. ? Most adults should exercise for at least 150 minutes each week. The exercise should increase your heart rate and make you sweat (moderate-intensity exercise). ? Most adults should also do strengthening exercises at least twice a week. This is in addition to the moderate-intensity exercise. Maintain a healthy weight  Body mass index (BMI) is a measurement that can be used to identify possible weight problems. It estimates body fat based on height and weight. Your health care provider can help determine your BMI and help you achieve or maintain a healthy weight.  For females 22 years of age and older: ? A BMI below 18.5 is considered underweight. ? A BMI of 18.5 to 24.9 is normal. ? A BMI of 25 to 29.9 is considered overweight. ? A BMI of 30 and above is considered  obese. Watch levels of cholesterol and blood lipids  You should start having your blood tested for lipids and cholesterol at 39 years of age, then have this test every 5 years.  You may need to have your cholesterol levels checked more often if: ? Your lipid or cholesterol levels are high. ? You are older than 39 years of age. ? You are at high risk for heart disease. Cancer screening Lung Cancer  Lung cancer screening is recommended for adults 77-10 years old who are at high risk for lung cancer because of a history of smoking.  A yearly low-dose CT scan of the lungs is recommended for people who: ? Currently smoke. ? Have quit within the past 15 years. ? Have at least a 30-pack-year history of smoking. A pack year is smoking an average of one pack of cigarettes a day for 1 year.  Yearly screening should continue until it has been 15 years since you quit.  Yearly screening should stop if you develop a health problem that would prevent you from having lung cancer treatment. Breast Cancer  Practice breast self-awareness. This means understanding how your breasts normally appear and feel.  It also means doing regular breast self-exams. Let your health care provider know about any changes, no matter how small.  If you are in your 20s or 30s, you should have a clinical breast exam (CBE) by a health care provider every 1-3 years as part of a regular health exam.  If you are 40 or older, have a CBE every year. Also consider having a breast X-ray (mammogram) every year.  If you have a family history of breast cancer, talk to your health care provider about genetic screening.  If you are at high risk for breast cancer, talk to your health care provider about having an MRI and a mammogram every year.  Breast cancer gene (BRCA) assessment is recommended for women who have family members with BRCA-related cancers. BRCA-related cancers include: ? Breast. ? Ovarian. ? Tubal. ? Peritoneal  cancers.  Results of the assessment will determine the need for genetic counseling and BRCA1 and BRCA2 testing. Cervical Cancer Your health care provider may recommend that you be screened regularly for cancer of the pelvic organs (ovaries, uterus, and vagina). This screening involves a pelvic examination, including checking for microscopic changes to the surface of your cervix (Pap test). You may be encouraged to have this screening done every 3 years, beginning at age 21.  For women ages 30-65, health care providers may recommend pelvic exams and Pap testing every 3 years, or they may recommend the Pap and pelvic exam, combined with testing for human papilloma virus (HPV), every 5 years. Some types of HPV increase your risk of cervical cancer. Testing for HPV may also be done on women of any age with unclear Pap test results.  Other health care providers may not recommend any screening for nonpregnant women who are considered low risk for pelvic cancer and who do not have symptoms. Ask your health care provider if a screening pelvic exam is right for you.  If you have had past treatment for cervical cancer or a condition that could lead to cancer, you need Pap tests and screening for cancer for at least 20 years after your treatment. If Pap tests have been discontinued, your risk factors (such as having a new sexual partner) need to be reassessed to determine if screening should resume. Some women have medical problems that increase the chance of getting cervical cancer. In these cases, your health care provider may recommend more frequent screening and Pap tests. Colorectal Cancer  This type of cancer can be detected and often prevented.  Routine colorectal cancer screening usually begins at 39 years of age and continues through 39 years of age.  Your health care provider may recommend screening at an earlier age if you have risk factors for colon cancer.  Your health care provider may also  recommend using home test kits to check for hidden blood in the stool.  A small camera at the end of a tube can be used to examine your colon directly (sigmoidoscopy or colonoscopy). This is done to check for the earliest forms of colorectal cancer.  Routine screening usually begins at age 50.  Direct examination of the colon should be repeated every 5-10 years through 39 years of age. However, you may need to be screened more often if early forms of precancerous polyps or small growths are found. Skin Cancer  Check your skin from head to toe regularly.  Tell your health care provider about any new moles or changes in moles, especially if there is a change in a mole's shape or color.  Also tell your health care provider if you have a mole that is larger than the size of a pencil eraser.  Always use sunscreen. Apply sunscreen liberally and repeatedly throughout the day.  Protect yourself by wearing long sleeves, pants, a wide-brimmed hat, and sunglasses whenever you   are outside. Heart disease, diabetes, and high blood pressure  High blood pressure causes heart disease and increases the risk of stroke. High blood pressure is more likely to develop in: ? People who have blood pressure in the high end of the normal range (130-139/85-89 mm Hg). ? People who are overweight or obese. ? People who are African American.  If you are 31-4 years of age, have your blood pressure checked every 3-5 years. If you are 28 years of age or older, have your blood pressure checked every year. You should have your blood pressure measured twice-once when you are at a hospital or clinic, and once when you are not at a hospital or clinic. Record the average of the two measurements. To check your blood pressure when you are not at a hospital or clinic, you can use: ? An automated blood pressure machine at a pharmacy. ? A home blood pressure monitor.  If you are between 66 years and 2 years old, ask your health  care provider if you should take aspirin to prevent strokes.  Have regular diabetes screenings. This involves taking a blood sample to check your fasting blood sugar level. ? If you are at a normal weight and have a low risk for diabetes, have this test once every three years after 39 years of age. ? If you are overweight and have a high risk for diabetes, consider being tested at a younger age or more often. Preventing infection Hepatitis B  If you have a higher risk for hepatitis B, you should be screened for this virus. You are considered at high risk for hepatitis B if: ? You were born in a country where hepatitis B is common. Ask your health care provider which countries are considered high risk. ? Your parents were born in a high-risk country, and you have not been immunized against hepatitis B (hepatitis B vaccine). ? You have HIV or AIDS. ? You use needles to inject street drugs. ? You live with someone who has hepatitis B. ? You have had sex with someone who has hepatitis B. ? You get hemodialysis treatment. ? You take certain medicines for conditions, including cancer, organ transplantation, and autoimmune conditions. Hepatitis C  Blood testing is recommended for: ? Everyone born from [birth year] through 1966. ? Anyone with known risk factors for hepatitis C. Sexually transmitted infections (STIs)  You should be screened for sexually transmitted infections (STIs) including gonorrhea and chlamydia if: ? You are sexually active and are younger than 39 years of age. ? You are older than 38 years of age and your health care provider tells you that you are at risk for this type of infection. ? Your sexual activity has changed since you were last screened and you are at an increased risk for chlamydia or gonorrhea. Ask your health care provider if you are at risk.  If you do not have HIV, but are at risk, it may be recommended that you take a prescription medicine daily to prevent HIV  infection. This is called pre-exposure prophylaxis (PrEP). You are considered at risk if: ? You are sexually active and do not regularly use condoms or know the HIV status of your partner(s). ? You take drugs by injection. ? You are sexually active with a partner who has HIV. Talk with your health care provider about whether you are at high risk of being infected with HIV. If you choose to begin PrEP, you should first be tested for HIV. You  should then be tested every 3 months for as long as you are taking PrEP. Pregnancy  If you are premenopausal and you may become pregnant, ask your health care provider about preconception counseling.  If you may become pregnant, take 400 to 800 micrograms (mcg) of folic acid every day.  If you want to prevent pregnancy, talk to your health care provider about birth control (contraception). Osteoporosis and menopause  Osteoporosis is a disease in which the bones lose minerals and strength with aging. This can result in serious bone fractures. Your risk for osteoporosis can be identified using a bone density scan.  If you are 41 years of age or older, or if you are at risk for osteoporosis and fractures, ask your health care provider if you should be screened.  Ask your health care provider whether you should take a calcium or vitamin D supplement to lower your risk for osteoporosis.  Menopause may have certain physical symptoms and risks.  Hormone replacement therapy may reduce some of these symptoms and risks. Talk to your health care provider about whether hormone replacement therapy is right for you. Follow these instructions at home:  Schedule regular health, dental, and eye exams.  Stay current with your immunizations.  Do not use any tobacco products including cigarettes, chewing tobacco, or electronic cigarettes.  If you are pregnant, do not drink alcohol.  If you are breastfeeding, limit how much and how often you drink alcohol.  Limit  alcohol intake to no more than 1 drink per day for nonpregnant women. One drink equals 12 ounces of beer, 5 ounces of wine, or 1 ounces of hard liquor.  Do not use street drugs.  Do not share needles.  Ask your health care provider for help if you need support or information about quitting drugs.  Tell your health care provider if you often feel depressed.  Tell your health care provider if you have ever been abused or do not feel safe at home. This information is not intended to replace advice given to you by your health care provider. Make sure you discuss any questions you have with your health care provider. Document Released: 12/01/2010 Document Revised: 10/24/2015 Document Reviewed: 02/19/2015 Elsevier Interactive Patient Education  2019 Index is a normal reaction to life events. Stress is what you feel when life demands more than you are used to, or more than you think you can handle. Some stress can be useful, such as studying for a test or meeting a deadline at work. Stress that occurs too often or for too long can cause problems. It can affect your emotional health and interfere with relationships and normal daily activities. Too much stress can weaken your body's defense system (immune system) and increase your risk for physical illness. If you already have a medical problem, stress can make it worse. What are the causes? All sorts of life events can cause stress. An event that causes stress for one person may not be stressful for another person. Major life events, whether positive or negative, commonly cause stress. Examples include:  Losing a job or starting a new job.  Losing a loved one.  Moving to a new town or home.  Getting married or divorced.  Having a baby.  Injury or illness. Less obvious life events can also cause stress, especially if they occur day after day or in combination with each other. Examples include:  Working long  hours.  Driving in traffic.  Caring  for children.  Being in debt.  Being in a difficult relationship. What are the signs or symptoms? Stress can cause emotional symptoms, including:  Anxiety. This is feeling worried, afraid, on edge, overwhelmed, or out of control.  Anger, including irritation or impatience.  Depression. This is feeling sad, down, helpless, or guilty.  Trouble focusing, remembering, or making decisions. Stress can cause physical symptoms, including:  Aches and pains. These may affect your head, neck, back, stomach, or other areas of your body.  Tight muscles or a clenched jaw.  Low energy.  Trouble sleeping. Stress can cause unhealthy behaviors, including:  Eating to feel better (overeating) or skipping meals.  Working too much or putting off tasks.  Smoking, drinking alcohol, or using drugs to feel better. How is this diagnosed? Stress is diagnosed through an assessment by your health care provider. He or she may diagnose this condition based on:  Your symptoms and any stressful life events.  Your medical history.  Tests to rule out other causes of your symptoms. Depending on your condition, your health care provider may refer you to a specialist for further evaluation. How is this treated?  Stress management techniques are the recommended treatment for stress. Medicine is not typically recommended for the treatment of stress. Techniques to reduce your reaction to stressful life events include:  Stress identification. Monitor yourself for symptoms of stress and identify what causes stress for you. These skills may help you to avoid or prepare for stressful events.  Time management. Set your priorities, keep a calendar of events, and learn to say "no." Taking these actions can help you avoid making too many commitments. Techniques for coping with stress include:  Rethinking the problem. Try to think realistically about stressful events rather  than ignoring them or overreacting. Try to find the positives in a stressful situation rather than focusing on the negatives.  Exercise. Physical exercise can release both physical and emotional tension. The key is to find a form of exercise that you enjoy and do it regularly.  Relaxation techniques. These relax the body and mind. The key is to find one or more that you enjoy and use the technique(s) regularly. Examples include: ? Meditation, deep breathing, or progressive relaxation techniques. ? Yoga or tai chi. ? Biofeedback, mindfulness techniques, or journaling. ? Listening to music, being out in nature, or participating in other hobbies.  Practicing a healthy lifestyle. Eat a balanced diet, drink plenty of water, limit or avoid caffeine, and get plenty of sleep.  Having a strong support network. Spend time with family, friends, or other people you enjoy being around. Express your feelings and talk things over with someone you trust. Counseling or talk therapy with a mental health professional may be helpful if you are having trouble managing stress on your own. Follow these instructions at home: Lifestyle   Avoid drugs.  Do not use any products that contain nicotine or tobacco, such as cigarettes and e-cigarettes. If you need help quitting, ask your health care provider.  Limit alcohol intake to no more than 1 drink a day for nonpregnant women and 2 drinks a day for men. One drink equals 12 oz of beer, 5 oz of wine, or 1 oz of hard liquor.  Do not use alcohol or drugs to relax.  Eat a balanced diet that includes fresh fruits and vegetables, whole grains, lean meats, fish, eggs, and beans, and low-fat dairy. Avoid processed foods and foods high in added fat, sugar, and salt.  Exercise at least 30 minutes on 5 or more days each week.  Get 7-8 hours of sleep each night. General instructions   Practice stress management techniques as discussed with your health care  provider.  Drink enough fluid to keep your urine clear or pale yellow.  Take over-the-counter and prescription medicines only as told by your health care provider.  Keep all follow-up visits as told by your health care provider. This is important. Contact a health care provider if:  Your symptoms get worse.  You have new symptoms.  You feel overwhelmed by your problems and can no longer manage them on your own. Get help right away if:  You have thoughts of hurting yourself or others. If you ever feel like you may hurt yourself or others, or have thoughts about taking your own life, get help right away. You can go to your nearest emergency department or call:  Your local emergency services (911 in the [place]  A suicide crisis helpline, such as the [facility] at 917 187 9269. This is open 24 hours a day. Summary  Stress is a normal reaction to life events. It can cause problems if it happens too often or for too long.  Practicing stress management techniques is the best way to treat stress.  Counseling or talk therapy with a mental health professional may be helpful if you are having trouble managing stress on your own. This information is not intended to replace advice given to you by your health care provider. Make sure you discuss any questions you have with your health care provider. Document Released: 11/11/2000 Document Revised: 07/08/2016 Document Reviewed: 07/08/2016 Elsevier Interactive Patient Education  2019 [employer]   Fibrocystic Breast Changes  Fibrocystic breast changes are changes in breast tissue that can cause breasts to become swollen, lumpy, or painful. This can happen due to buildup of scar-like tissue (fibrous tissue) or the forming of fluid-filled lumps (cysts) in the breast. This is a common condition, and it is not cancerous (is benign). The exact cause is not known, but it seems to occur when women go through hormonal  changes during their menstrual cycle. Fibrocystic breast changes can affect one or both breasts. What are the causes? The exact cause of fibrocystic breast changes is not known. However, this condition:  May be related to the female hormones estrogen and progesterone.  May be influenced by family traits that get passed from parent to child (genetics). What are the signs or symptoms? Symptoms of this condition may affect one or both breasts, and may include:  Tenderness, mild discomfort, or pain.  Swelling.  Rope-like tissue that can be felt when touching the breast.  Lumps in one or both breasts.  Changes in breast size. Breasts may get larger before the menstrual period and smaller after the menstrual period.  Green or dark brown discharge from the nipple. Symptoms are usually worse before menstrual periods start, and they get better toward the end of menstrual periods. How is this diagnosed? This condition is diagnosed based on your medical history and a physical exam of your breasts. You may also have tests, such as:  A breast X-ray (mammogram).  Ultrasound of your breasts.  MRI.  Removal of a breast tissue sample for testing (breast biopsy). This may be done if your health care provider thinks that something else may be causing changes in your breasts. How is this treated? Often, treatment is not needed for this condition. In some cases, treatment may include:  Taking over-the-counter pain relievers to help lessen pain or discomfort.  Limiting or avoiding caffeine. Foods and beverages that contain caffeine include chocolate, soda, coffee, and tea.  Reducing sugar and fat in your diet. Your health care provider may also recommend:  A procedure to remove fluid from a cyst that is causing pain (fine needle aspiration).  Surgery to remove a cyst that is large or tender or does not go away. Follow these instructions at home:  Examine your breasts after every menstrual  period. If you do not have menstrual periods, check your breasts on the first day of every month. Feel for changes in your breasts, such as: ? More tenderness. ? A new growth. ? A change in size. ? A change in an existing lump.  Take over-the-counter and prescription medicines only as told by your health care provider.  Wear a well-fitted support or sports bra, especially when exercising.  Decrease or avoid caffeine, fat, and sugar in your diet as directed by your health care provider. Contact a health care provider if:  You have fluid leaking from your nipple, especially if it is bloody.  You have new lumps or bumps in your breast.  Your breast becomes enlarged, red, and painful.  You have areas of your breast that pucker inward.  Your nipple appears flat or indented. Get help right away if:  You have redness of your breast and the redness is spreading. Summary  Fibrocystic breast changes are changes in breast tissue that can cause breasts to become swollen, lumpy, or painful.  This condition may be related to the female hormones estrogen and progesterone.  With this condition, it is important to examine your breasts after every menstrual period. If you do not have menstrual periods, check your breasts on the first day of every month. This information is not intended to replace advice given to you by your health care provider. Make sure you discuss any questions you have with your health care provider. Document Released: 03/04/2006 Document Revised: 01/28/2016 Document Reviewed: 01/15/2016 Elsevier Interactive Patient Education  2019 [employer].

## 2018-11-08 ENCOUNTER — Encounter: Payer: Self-pay | Admitting: *Deleted

## 2018-11-08 NOTE — Progress Notes (Signed)
Please call patient: I have reviewed his/her lab results. All lab tests are normal.

## 2018-12-13 ENCOUNTER — Ambulatory Visit (INDEPENDENT_AMBULATORY_CARE_PROVIDER_SITE_OTHER): Payer: Managed Care, Other (non HMO) | Admitting: Family Medicine

## 2018-12-13 ENCOUNTER — Other Ambulatory Visit: Payer: Self-pay

## 2018-12-13 ENCOUNTER — Ambulatory Visit: Payer: Managed Care, Other (non HMO) | Admitting: Family Medicine

## 2018-12-13 DIAGNOSIS — R300 Dysuria: Secondary | ICD-10-CM

## 2018-12-13 MED ORDER — NITROFURANTOIN MONOHYD MACRO 100 MG PO CAPS: 100.0000 mg | ORAL_CAPSULE | Freq: Two times a day (BID) | ORAL | 0 refills | Status: DC

## 2018-12-13 NOTE — Progress Notes (Signed)
Virtual Visit via Video Note  I connected with Victoria Mcintosh  on 12/13/18 at 12:40 PM EDT by a video enabled telemedicine application and verified that I am speaking with the correct person using two identifiers.  Location patient: home Location provider:work or home office Persons participating in the virtual visit: patient, provider  I discussed the limitations of evaluation and management by telemedicine and the availability of in person appointments. The patient expressed understanding and agreed to proceed.   HPI:  Acute visit for Dysuria: -started 2 days ago -symptoms include: mild discomfort with urination, odor to the urine, frequency urgency -she has been drinking more water and feels a little better, but had some mild symptoms -denies fevers, malaise, abd or flank pain, hematuria, NVD, vaginal discharge, new sexual partners or concern for STI -FDLMP: July 10th  ROS: See pertinent positives and negatives per HPI.  Past Medical History:  Diagnosis Date  . GERD (gastroesophageal reflux disease)     Past Surgical History:  Procedure Laterality Date  . CESAREAN SECTION      Family History  Problem Relation Age of Onset  . Hyperlipidemia Mother   . Hypertension Mother   . Miscarriages / [place] Mother   . Heart attack Father   . Heart disease Father   . Hyperlipidemia Father   . Hypertension Father   . Healthy Daughter   . Learning disabilities Son   . Arthritis Maternal Grandmother   . Diabetes Maternal Grandmother   . Cancer Paternal Grandfather     SOCIAL HX: see hpi   Current Outpatient Medications:  .  Ascorbic Acid (VITAMIN C) 250 MG CHEW, Chew by mouth., Disp: , Rfl:  .  fluticasone (FLONASE) 50 MCG/ACT nasal spray, Place 2 sprays into both nostrils daily., Disp: 16 g, Rfl: 6 .  Multiple Vitamins-Minerals (MULTIVITAMIN WITH MINERALS) tablet, Take 1 tablet by mouth daily., Disp: , Rfl:  .  nitrofurantoin, macrocrystal-monohydrate, (MACROBID) 100 MG capsule,  Take 1 capsule (100 mg total) by mouth 2 (two) times daily., Disp: 14 capsule, Rfl: 0  EXAM:  VITALS per patient if applicable:  GENERAL: alert, oriented, appears well and in no acute distress  HEENT: atraumatic, conjunttiva clear, no obvious abnormalities on inspection of external nose and ears  NECK: normal movements of the head and neck  LUNGS: on inspection no signs of respiratory distress, breathing rate appears normal, no obvious gross SOB, gasping or wheezing  CV: no obvious cyanosis  MS: moves all visible extremities without noticeable abnormality  PSYCH/NEURO: pleasant and cooperative, no obvious depression or anxiety, speech and thought processing grossly intact  ASSESSMENT AND PLAN:  Discussed the following assessment and plan:  Dysuria   -we discussed possible serious and likely etiologies, options for further workup and treatment, treatment risks and return precautions. Suspect uncomplicated cystitis.  -after this discussion, Victoria Mcintosh opted for trial emperic treatment for UTI with Macrobid 100mg  bid x 7 days. Discussed AZO if needed for symptoms. -follow up advised as needed if symptoms worsen or persist or new concerns arise.       Lucretia Kern, DO

## 2018-12-13 NOTE — Patient Instructions (Signed)
  I hope you are feeling better soon! Seek care promptly if your symptoms worsen, new concerns arise or you are not improving with treatment.  Take the Macrobid as prescribed.      Dysuria Dysuria is pain or discomfort while urinating. The pain or discomfort may be felt in the part of your body that drains urine from the bladder (urethra) or in the surrounding tissue of the genitals. The pain may also be felt in the groin area, lower abdomen, or lower back. You may have to urinate frequently or have the sudden feeling that you have to urinate (urgency). Dysuria can affect both men and women, but it is more common in women. Dysuria can be caused by many different things, including:  Urinary tract infection.  Kidney stones or bladder stones.  Certain sexually transmitted infections (STIs), such as chlamydia.  Dehydration.  Inflammation of the tissues of the vagina.  Use of certain medicines.  Use of certain soaps or scented products that cause irritation. Follow these instructions at home: General instructions  Watch your condition for any changes.  Urinate often. Avoid holding urine for long periods of time.  After a bowel movement or urination, women should cleanse from front to back, using each tissue only once.  Urinate after sexual intercourse.  Keep all follow-up visits as told by your health care provider. This is important.  If you had any tests done to find the cause of dysuria, it is up to you to get your test results. Ask your health care provider, or the department that is doing the test, when your results will be ready. Eating and drinking   Drink enough fluid to keep your urine pale yellow.  Avoid caffeine, tea, and alcohol. They can irritate the bladder and make dysuria worse. In men, alcohol may irritate the prostate. Medicines  Take over-the-counter and prescription medicines only as told by your health care provider.  If you were prescribed an  antibiotic medicine, take it as told by your health care provider. Do not stop taking the antibiotic even if you start to feel better. Contact a health care provider if:  You have a fever.  You develop pain in your back or sides.  You have nausea or vomiting.  You have blood in your urine.  You are not urinating as often as you usually do. Get help right away if:  Your pain is severe and not relieved with medicines.  You cannot eat or drink without vomiting.  You are confused.  You have a rapid heartbeat while at rest.  You have shaking or chills.  You feel extremely weak. Summary  Dysuria is pain or discomfort while urinating. Many different conditions can lead to dysuria.  If you have dysuria, you may have to urinate frequently or have the sudden feeling that you have to urinate (urgency).  Watch your condition for any changes. Keep all follow-up visits as told by your health care provider.  Make sure that you urinate often and drink enough fluid to keep your urine pale yellow. This information is not intended to replace advice given to you by your health care provider. Make sure you discuss any questions you have with your health care provider. Document Released: 02/14/2004 Document Revised: 04/30/2017 Document Reviewed: 03/04/2017 Elsevier Patient Education  2020 [employer].

## 2019-01-18 ENCOUNTER — Ambulatory Visit: Payer: Managed Care, Other (non HMO) | Admitting: Family Medicine

## 2020-03-04 ENCOUNTER — Encounter: Payer: Self-pay | Admitting: Physician Assistant

## 2020-03-04 ENCOUNTER — Ambulatory Visit (INDEPENDENT_AMBULATORY_CARE_PROVIDER_SITE_OTHER): Payer: Managed Care, Other (non HMO) | Admitting: Physician Assistant

## 2020-03-04 ENCOUNTER — Other Ambulatory Visit: Payer: Self-pay

## 2020-03-04 VITALS — BP 100/68 | HR 69 | Temp 98.1°F | Ht <= 58 in | Wt 114.0 lb

## 2020-03-04 DIAGNOSIS — L989 Disorder of the skin and subcutaneous tissue, unspecified: Secondary | ICD-10-CM | POA: Diagnosis not present

## 2020-03-04 MED ORDER — DOXYCYCLINE HYCLATE 100 MG PO TABS: 100.0000 mg | ORAL_TABLET | Freq: Two times a day (BID) | ORAL | 0 refills | Status: DC

## 2020-03-04 NOTE — Progress Notes (Signed)
Victoria Mcintosh is a 39 y.o. female here for a new problem.  I acted as a Neurosurgeon for [employer], PA-C Corky Mull, LPN   History of Present Illness:   Chief Complaint  Patient presents with  . Mass    HPI   Lump Pt c/o lump in right axilla, noticed it Friday night. She is having some pain, has not taken any medicine. Denies pain in breast, breast lumps and no discharge from nipple.  Patient reports that she noticed the area was inflamed after shaving.  She did go on a hike for 4 hours and had some irritation afterwards.  She denies any discharge from the area.  She also denies fever, chills.  She states that she always has nausea so this is not unusual for her.  She has had some malaise recently.  Patient's last menstrual period was 02/26/2020.   Past Medical History:  Diagnosis Date  . GERD (gastroesophageal reflux disease)      Social History   Tobacco Use  . Smoking status: Former Smoker    Packs/day: 0.25    Years: 2.00    Pack years: 0.50    Types: Cigarettes    Quit date: 03/1999    Years since quitting: 21.0  . Smokeless tobacco: Never Used  Vaping Use  . Vaping Use: Never used  Substance Use Topics  . Alcohol use: Never  . Drug use: Never    Past Surgical History:  Procedure Laterality Date  . CESAREAN SECTION      Family History  Problem Relation Age of Onset  . Hyperlipidemia Mother   . Hypertension Mother   . Miscarriages / [place] Mother   . Heart attack Father   . Heart disease Father   . Hyperlipidemia Father   . Hypertension Father   . Healthy Daughter   . Learning disabilities Son   . Arthritis Maternal Grandmother   . Diabetes Maternal Grandmother   . Cancer Paternal Grandfather     No Known Allergies  Current Medications:   Current Outpatient Medications:  .  aspirin EC 81 MG tablet, Take 81 mg by mouth daily. Swallow whole., Disp: , Rfl:  .  BLACK ELDERBERRY,BERRY-FLOWER, PO, Take 1 capsule by mouth daily. Elder  berry 500 mg , Vit C 400 mg  And 125 mcg Vit D3, Disp: , Rfl:  .  Coenzyme Q10 (CO Q10) 100 MG CAPS, Take 1 capsule by mouth daily., Disp: , Rfl:  .  Magnesium 250 MG TABS, Take 1 tablet by mouth daily., Disp: , Rfl:  .  Multiple Vitamins-Minerals (ALIVE WOMENS ENERGY) TABS, Take 1 tablet by mouth daily., Disp: , Rfl:  .  TURMERIC PO, Take 1,000 mg by mouth daily., Disp: , Rfl:  .  zinc gluconate 50 MG tablet, Take 50 mg by mouth daily., Disp: , Rfl:  .  Ascorbic Acid (VITAMIN C) 250 MG CHEW, Chew by mouth., Disp: , Rfl:  .  doxycycline (VIBRA-TABS) 100 MG tablet, Take 1 tablet (100 mg total) by mouth 2 (two) times daily., Disp: 20 tablet, Rfl: 0   Review of Systems:   ROS  Negative unless otherwise specified per HPI.   Vitals:   Vitals:   03/04/20 1155  BP: 100/68  Pulse: 69  Temp: 98.1 F (36.7 C)  TempSrc: Temporal  SpO2: 99%  Weight: 114 lb (51.7 kg)  Height: 4' 9.5" (1.461 m)     Body mass index is 24.24 kg/m.  Physical Exam:   Physical  Exam Constitutional:      Appearance: She is well-developed.  HENT:     Head: Normocephalic and atraumatic.  Eyes:     Conjunctiva/sclera: Conjunctivae normal.  Pulmonary:     Effort: Pulmonary effort is normal.  Musculoskeletal:        General: Normal range of motion.     Cervical back: Normal range of motion and neck supple.  Skin:    General: Skin is warm and dry.     Comments: 2 small raised slightly erythematous cystlike areas to right axilla, and small area of erythema to distal R axilla; raised areas are tender to palpation and slightly indurated.  No fluctuance.  Neurological:     Mental Status: She is alert and oriented to person, place, and time.  Psychiatric:        Behavior: Behavior normal.        Thought Content: Thought content normal.        Judgment: Judgment normal.       Assessment and Plan:   Khamya was seen today for mass.  Diagnoses and all orders for this visit:  Skin lesion Suspect possible  underlying infection, folliculitis. We will treat with oral doxycycline per orders. Recommend avoiding shaving area. Worsening precautions extensively reviewed with patient. Follow-up in 1 week if symptoms are not resolved or if any other concerns. If symptoms persist would consider labs and possible ultrasound for further evaluation.  Other orders -     doxycycline (VIBRA-TABS) 100 MG tablet; Take 1 tablet (100 mg total) by mouth 2 (two) times daily.   CMA or LPN served as scribe during this visit. History, Physical, and Plan performed by medical provider. The above documentation has been reviewed and is accurate and complete.   Jarold Motto, PA-C

## 2020-03-04 NOTE — Patient Instructions (Signed)
It was great to see you!  Start oral doxycycline antibiotic today.  If any worsening or new symptoms develop (fever, chills, breast pain, etc) please let me know ASAP.  If symptoms persist after a week, please let me or Dr. Mardelle Matte know.  Avoid shaving until completion of antibiotic.  Take care,  Jarold Motto PA-C

## 2020-03-11 ENCOUNTER — Telehealth: Payer: Self-pay

## 2020-03-11 ENCOUNTER — Ambulatory Visit (INDEPENDENT_AMBULATORY_CARE_PROVIDER_SITE_OTHER): Payer: Managed Care, Other (non HMO) | Admitting: Family Medicine

## 2020-03-11 ENCOUNTER — Other Ambulatory Visit: Payer: Self-pay

## 2020-03-11 ENCOUNTER — Encounter: Payer: Self-pay | Admitting: Family Medicine

## 2020-03-11 VITALS — BP 104/68 | HR 84 | Temp 98.3°F | Wt 114.4 lb

## 2020-03-11 DIAGNOSIS — L732 Hidradenitis suppurativa: Secondary | ICD-10-CM

## 2020-03-11 NOTE — Progress Notes (Signed)
Subjective  CC:  Chief Complaint  Patient presents with  . Adenopathy    under right arm, swelling and original red spot is gone, has new swollen spot     HPI: Victoria Mcintosh is a 39 y.o. female who presents to the office today to address the problems listed above in the chief complaint.  Reviewed notes from recent office visit.  Tender nodule over the right axilla treated with doxycycline.  Mildly improved but now new area is present.  No drainage.  No fevers or systemic symptoms.   Assessment  1. Hidradenitis axillaris      Plan   Hidradenitis axillaris: Improving.  To complete 10-day course of doxycycline.  Start warm compresses as needed.  Tylenol or Advil.  Add antibacterial soap.  Follow-up if worsening.  Overdue for complete physical.  Asked to schedule  Follow up: As needed Visit date not found  Orders Placed This Encounter  Procedures  . CBC with Differential/Platelet  . Sedimentation rate   No orders of the defined types were placed in this encounter.     I reviewed the patients updated PMH, FH, and SocHx.    Patient Active Problem List   Diagnosis Date Noted  . Chronic allergic rhinitis 11/07/2018  . Chronic cough 10/11/2017   Current Meds  Medication Sig  . Ascorbic Acid (VITAMIN C) 250 MG CHEW Chew by mouth.  Marland Kitchen aspirin EC 81 MG tablet Take 81 mg by mouth daily. Swallow whole.  Marland Kitchen BLACK ELDERBERRY,BERRY-FLOWER, PO Take 1 capsule by mouth daily. Elder berry 500 mg , Vit C 400 mg  And 125 mcg Vit D3  . Coenzyme Q10 (CO Q10) 100 MG CAPS Take 1 capsule by mouth daily.  Marland Kitchen doxycycline (VIBRA-TABS) 100 MG tablet Take 1 tablet (100 mg total) by mouth 2 (two) times daily.  . Magnesium 250 MG TABS Take 1 tablet by mouth daily.  . Multiple Vitamins-Minerals (ALIVE WOMENS ENERGY) TABS Take 1 tablet by mouth daily.  . TURMERIC PO Take 1,000 mg by mouth daily.  Marland Kitchen zinc gluconate 50 MG tablet Take 50 mg by mouth daily.    Allergies: Patient has No Known  Allergies. Family History: Patient family history includes Arthritis in her maternal grandmother; Cancer in her paternal grandfather; Diabetes in her maternal grandmother; Healthy in her daughter; Heart attack in her father; Heart disease in her father; Hyperlipidemia in her father and mother; Hypertension in her father and mother; Learning disabilities in her son; Miscarriages / [place] in her mother.  Social History:  Patient  reports that she quit smoking about 21 years ago. Her smoking use included cigarettes. She has a 0.50 pack-year smoking history. She has never used smokeless tobacco. She reports that she does not drink alcohol and does not use drugs.  Review of Systems: Constitutional: Negative for fever malaise or anorexia Cardiovascular: negative for chest pain Respiratory: negative for SOB or persistent cough Gastrointestinal: negative for abdominal pain  Objective  Vitals: BP 104/68   Pulse 84   Temp 98.3 F (36.8 C) (Temporal)   Wt 114 lb 6.4 oz (51.9 kg)   LMP 02/26/2020   SpO2 99%   BMI 24.33 kg/m  General: no acute distress , A&Ox3 Right axilla: 2 small tender nodules, less than 5 mm each.  No erythema or warmth.  No fluctuance.    Commons side effects, risks, benefits, and alternatives for medications and treatment plan prescribed today were discussed, and the patient expressed understanding of the given instructions.  Patient is instructed to call or message via MyChart if he/she has any questions or concerns regarding our treatment plan. No barriers to understanding were identified. We discussed Red Flag symptoms and signs in detail. Patient expressed understanding regarding what to do in case of urgent or emergency type symptoms.   Medication list was reconciled, printed and provided to the patient in AVS. Patient instructions and summary information was reviewed with the patient as documented in the AVS. This note was prepared with assistance of Dragon voice  recognition software. Occasional wrong-word or sound-a-like substitutions may have occurred due to the inherent limitations of voice recognition software  This visit occurred during the SARS-CoV-2 public health emergency.  Safety protocols were in place, including screening questions prior to the visit, additional usage of staff PPE, and extensive cleaning of exam room while observing appropriate contact time as indicated for disinfecting solutions.

## 2020-03-11 NOTE — Patient Instructions (Addendum)
Please return for your annual complete physical; please come fasting. Your last was in June 2020.  I will release your lab results to you on your MyChart account with further instructions. Please reply with any questions.    If you have any questions or concerns, please don't hesitate to send me a message via MyChart or call the office at [phone]. Thank you for visiting with Victoria Mcintosh today! It's our pleasure caring for you.   Hidradenitis Suppurativa Hidradenitis suppurativa is a long-term (chronic) skin disease. It is similar to a severe form of acne, but it affects areas of the body where acne would be unusual, especially areas of the body where skin rubs against skin and becomes moist. These include:  Underarms.  Groin.  Genital area.  Buttocks.  Upper thighs.  Breasts. Hidradenitis suppurativa may start out as small lumps or pimples caused by blocked sweat glands or hair follicles. Pimples may develop into deep sores that break open (rupture) and drain pus. Over time, affected areas of skin may thicken and become scarred. This condition is rare and does not spread from person to person (non-contagious). What are the causes? The exact cause of this condition is not known. It may be related to:  Female and female hormones.  An overactive disease-fighting system (immune system). The immune system may over-react to blocked hair follicles or sweat glands and cause swelling and pus-filled sores. What increases the risk? You are more likely to develop this condition if you:  Are female.  Are 60-39 years old.  Have a family history of hidradenitis suppurativa.  Have a personal history of acne.  Are overweight.  Smoke.  Take the medicine lithium. What are the signs or symptoms? The first symptoms are usually painful bumps in the skin, similar to pimples. The condition may get worse over time (progress), or it may only cause mild symptoms. If the disease progresses, symptoms may  include:  Skin bumps getting bigger and growing deeper into the skin.  Bumps rupturing and draining pus.  Itchy, infected skin.  Skin getting thicker and scarred.  Tunnels under the skin (fistulas) where pus drains from a bump.  Pain during daily activities, such as pain during walking if your groin area is affected.  Emotional problems, such as stress or depression. This condition may affect your appearance and your ability or willingness to wear certain clothes or do certain activities. How is this diagnosed? This condition is diagnosed by a health care provider who specializes in skin diseases (dermatologist). You may be diagnosed based on:  Your symptoms and medical history.  A physical exam.  Testing a pus sample for infection.  Blood tests. How is this treated? Your treatment will depend on how severe your symptoms are. The same treatment will not work for everybody with this condition. You may need to try several treatments to find what works best for you. Treatment may include:  Cleaning and bandaging (dressing) your wounds as needed.  Lifestyle changes, such as new skin care routines.  Taking medicines, such as: ? Antibiotics. ? Acne medicines. ? Medicines to reduce the activity of the immune system. ? A diabetes medicine (metformin). ? Birth control pills, for women. ? Steroids to reduce swelling and pain.  Working with a mental health care provider, if you experience emotional distress due to this condition. If you have severe symptoms that do not get better with medicine, you may need surgery. Surgery may involve:  Using a laser to clear the skin and  remove hair follicles.  Opening and draining deep sores.  Removing the areas of skin that are diseased and scarred. Follow these instructions at home: Medicines   Take over-the-counter and prescription medicines only as told by your health care provider.  If you were prescribed an antibiotic medicine,  take it as told by your health care provider. Do not stop taking the antibiotic even if your condition improves. Skin care  If you have open wounds, cover them with a clean dressing as told by your health care provider. Keep wounds clean by washing them gently with soap and water when you bathe.  Do not shave the areas where you get hidradenitis suppurativa.  Do not wear deodorant.  Wear loose-fitting clothes.  Try to avoid getting overheated or sweaty. If you get sweaty or wet, change into clean, dry clothes as soon as you can.  To help relieve pain and itchiness, cover sore areas with a warm, clean washcloth (warm compress) for 5-10 minutes as often as needed.  If told by your health care provider, take a bleach bath twice a week: ? Fill your bathtub halfway with water. ? Pour in  cup of unscented household bleach. ? Soak in the tub for 5-10 minutes. ? Only soak from the neck down. Avoid water on your face and hair. ? Shower to rinse off the bleach from your skin. General instructions  Learn as much as you can about your disease so that you have an active role in your treatment. Work closely with your health care provider to find treatments that work for you.  If you are overweight, work with your health care provider to lose weight as recommended.  Do not use any products that contain nicotine or tobacco, such as cigarettes and e-cigarettes. If you need help quitting, ask your health care provider.  If you struggle with living with this condition, talk with your health care provider or work with a mental health care provider as recommended.  Keep all follow-up visits as told by your health care provider. This is important. Where to find more information  Hidradenitis Suppurativa [employer].: https://www.hs-foundation.org/ Contact a health care provider if you have:  A flare-up of hidradenitis suppurativa.  A fever or chills.  Trouble controlling your symptoms at  home.  Trouble doing your daily activities because of your symptoms.  Trouble dealing with emotional problems related to your condition. Summary  Hidradenitis suppurativa is a long-term (chronic) skin disease. It is similar to a severe form of acne, but it affects areas of the body where acne would be unusual.  The first symptoms are usually painful bumps in the skin, similar to pimples. The condition may get worse over time (progress), or it may only cause mild symptoms.  If you have open wounds, cover them with a clean dressing as told by your health care provider. Keep wounds clean by washing them gently with soap and water when you bathe.  Besides skin care, treatment may include medicines, laser treatment, and surgery. This information is not intended to replace advice given to you by your health care provider. Make sure you discuss any questions you have with your health care provider. Document Revised: 05/26/2017 Document Reviewed: 05/26/2017 Elsevier Patient Education  2020 [employer].

## 2020-03-11 NOTE — Telephone Encounter (Signed)
Noted  

## 2020-03-11 NOTE — Telephone Encounter (Signed)
Patient is calling in this morning, states she seen [facility] for painful lump in the arm pit was given antibiotics and was told to follow up. So Victoria Mcintosh says that this weekend she found a sore spot, more red and was painful thought maybe it was a swollen lymph node but last night the pain came back. Patient is scheduled for today at 1:30 with Dr.Andy

## 2020-03-12 LAB — CBC WITH DIFFERENTIAL/PLATELET
Absolute Monocytes: 722 cells/uL (ref 200–950)
Basophils Absolute: 53 cells/uL (ref 0–200)
Basophils Relative: 0.7 %
Eosinophils Absolute: 190 cells/uL (ref 15–500)
Eosinophils Relative: 2.5 %
HCT: 38.3 % (ref 35.0–45.0)
Hemoglobin: 13.1 g/dL (ref 11.7–15.5)
Lymphs Abs: 1801 cells/uL (ref 850–3900)
MCH: 31.5 pg (ref 27.0–33.0)
MCHC: 34.2 g/dL (ref 32.0–36.0)
MCV: 92.1 fL (ref 80.0–100.0)
MPV: 9.4 fL (ref 7.5–12.5)
Monocytes Relative: 9.5 %
Neutro Abs: 4834 cells/uL (ref 1500–7800)
Neutrophils Relative %: 63.6 %
Platelets: 350 10*3/uL (ref 140–400)
RBC: 4.16 10*6/uL (ref 3.80–5.10)
RDW: 11.4 % (ref 11.0–15.0)
Total Lymphocyte: 23.7 %
WBC: 7.6 10*3/uL (ref 3.8–10.8)

## 2020-03-12 LAB — SEDIMENTATION RATE: Sed Rate: 9 mm/h (ref 0–20)

## 2020-03-12 NOTE — Progress Notes (Signed)
Please call patient: I have reviewed his/her lab results. All lab results are perfectly normal. Follow instructions and she should do fine.

## 2020-05-27 ENCOUNTER — Encounter: Payer: Managed Care, Other (non HMO) | Admitting: Family Medicine

## 2020-09-17 ENCOUNTER — Ambulatory Visit (INDEPENDENT_AMBULATORY_CARE_PROVIDER_SITE_OTHER): Payer: Managed Care, Other (non HMO) | Admitting: Family

## 2020-09-17 ENCOUNTER — Other Ambulatory Visit: Payer: Self-pay

## 2020-09-17 ENCOUNTER — Encounter: Payer: Self-pay | Admitting: Family

## 2020-09-17 VITALS — BP 100/70 | HR 82 | Temp 98.4°F | Ht <= 58 in | Wt 113.4 lb

## 2020-09-17 DIAGNOSIS — M7041 Prepatellar bursitis, right knee: Secondary | ICD-10-CM | POA: Diagnosis not present

## 2020-09-17 NOTE — Patient Instructions (Addendum)
Rest, ice, ace wrap and elevate the knee Sports med consult placed. If better, you may cancel referral  Prepatellar Bursitis  Prepatellar bursitis is inflammation of the prepatellar bursa, which is a fluid-filled sac that cushions the kneecap (patella). Prepatellar bursitis happens when fluid builds up in this sac and causes it to swell. The condition causes knee pain. What are the causes? This condition may be caused by:  Constant pressure on the knees from kneeling.  A hit to the knee.  Falling on the knee.  Infection from bacteria.  Moving the knee often in a forceful way. What increases the risk? You are more likely to develop this condition if:  You play sports that have a high risk of falling on the knee or being hit on the knee. These include football, wrestling, basketball, or soccer.  You do work in which you kneel for long periods of time, such as roofing, plumbing, or gardening.  You have another inflammatory condition, such as gout or rheumatoid arthritis. What are the signs or symptoms? The most common symptom of this condition is knee pain that gets better with rest. Other symptoms include:  Swelling on the front of the kneecap.  Warmth in the knee.  Tenderness with activity.  Redness in the knee.  Inability to bend the knee or to kneel. How is this diagnosed? This condition is diagnosed based on:  A physical exam. Your health care provider will compare your knees and check for tenderness and pain while moving your knee.  Your medical history.  Tests to check for infection. These may include blood tests and tests on the fluid in the bursa.  Imaging tests, such as X-ray, MRI, or ultrasound, to check for damage in the patella, or fluid buildup and swelling in the bursa. How is this treated? This condition may be treated by:  Resting the knee.  Putting ice on the knee.  Taking medicines, such as: ? NSAIDs. These can help to reduce pain and  swelling. ? Antibiotics. These may be needed if you have an infection. ? Steroids. These are used to reduce swelling and inflammation, and may be prescribed if other treatments are not helping.  Raising (elevating) the knee while resting.  Doing exercises to help you maintain movement (physical therapy). These may be recommended after pain and swelling improve.  Having a procedure to remove fluid from the bursa. This may be done if other treatments are not helping.  Having surgery to remove the bursa. This may be done if you have a severe infection or if the condition keeps coming back after treatment. Follow these instructions at home: Medicines  Take over-the-counter and prescription medicines only as told by your health care provider.  If you were prescribed an antibiotic medicine, take it as told by your health care provider. Do not stop taking the antibiotic even if you start to feel better. Managing pain, stiffness, and swelling  If directed, put ice on the injured area. ? Put ice in a plastic bag. ? Place a towel between your skin and the bag. ? Leave the ice on for 20 minutes, 2-3 times a day.  Elevate the injured area above the level of your heart while you are sitting or lying down.   Activity  Do not use the injured limb to support your body weight until your health care provider says that you can.  Rest your knee.  Avoid activities that cause knee pain.  Return to your normal activities as told  by your health care provider. Ask your health care provider what activities are safe for you.  Do exercises as told by your health care provider. General instructions  Ask your health care provider when it is safe for you to drive.  Do not use any products that contain nicotine or tobacco, such as cigarettes, e-cigarettes, and chewing tobacco. These can delay healing. If you need help quitting, ask your health care provider.  Keep all follow-up visits as told by your  health care provider. This is important. How is this prevented?  Warm up and stretch before being active.  Cool down and stretch after being active.  Give your body time to rest between periods of activity.  Maintain physical fitness, including strength and flexibility.  Be safe and responsible while being active. This will help you to avoid falls.  Wear knee pads if you have to kneel for a long period of time. Contact a health care provider if:  Your symptoms do not improve or get worse.  Your symptoms keep coming back after treatment.  You develop a fever and have warmth, redness, or swelling over your knee. Summary  Prepatellar bursitis is inflammation of the prepatellar bursa, which is a fluid-filled sac that cushions the kneecap (patella).  This condition may be caused by injury or constant pressure on the knee. It may also be caused by an infection from bacteria.  Symptoms of this condition include pain, swelling, warmth, and tenderness in the knee.  Follow instructions from your health care provider about taking medicines, resting, and doing activities.  Contact your health care provider if your symptoms do not improve, get worse, or keep coming back after treatment. This information is not intended to replace advice given to you by your health care provider. Make sure you discuss any questions you have with your health care provider. Document Revised: 09/09/2018 Document Reviewed: 07/28/2018 Elsevier Patient Education  2021 [employer]    Prepatellar Bursitis Rehab Ask your health care provider which exercises are safe for you. Do exercises exactly as told by your health care provider and adjust them as directed. It is normal to feel mild stretching, pulling, tightness, or discomfort as you do these exercises. Stop right away if you feel sudden pain or your pain gets worse. Do not begin these exercises until told by your health care provider. Stretching and  range-of-motion exercises These exercises warm up your muscles and joints and improve the movement and flexibility of your knee. These exercises also help to relieve pain, numbness, and tingling. Hamstring stretch, standing This is an exercise in which you prop your leg on a chair and lean forward to achieve a stretch. To do this exercise: 1. Stand with your left / right heel resting on a chair. Your left / right leg should be fully extended. 2. Arch your lower back slightly. 3. Lean forward at the waist, leading with your chest, until you feel a gentle stretch in the back of your left / right knee or thigh. You should not need to lean far to feel the stretch. 4. Hold this position for __________ seconds. Repeat __________ times. Complete this exercise __________ times a day.   Knee flexion, active 1. Lie on your back with both knees straight. If this causes back discomfort, bend your healthy knee so your foot is flat on the floor. 2. Slowly slide your left / right heel back toward your buttocks (knee flexion). Stop when you feel a gentle stretch in the front  of your knee or thigh. 3. Hold this position for __________ seconds. 4. Slowly slide your left / right heel back to the starting position. Repeat __________ times. Complete this exercise __________ times a day.   Strengthening exercises These exercises build strength and endurance in your knee. Endurance is the ability to use your muscles for a long time, even after they get tired. Quadriceps, isometric This exercise stretches the muscles in the front of your thigh (quadriceps) without moving your knee joint (isometric). 1. Lie on your back with your left / right leg extended and your other knee bent. If told by your health care provider, put a rolled towel or a small pillow under your left / right knee. 2. Slowly tense the muscles in the front of your left / right thigh. You should see your kneecap slide up toward your hip or see increased  dimpling just above your knee. This motion will push the back of your knee down toward the surface that is under it. 3. For __________ seconds, hold the muscles as tight as you can without increasing your pain. 4. Relax the muscles slowly and completely after each repetition. Repeat __________ times. Complete this exercise __________ times a day.   Straight leg raises, supine This exercise is sometimes called a quadriceps and hip flexor exercise. 1. Lie on your back (supine position) with your left / right leg extended and your healthy knee bent. 2. Slowly tense the muscles in the front of your left / right thigh (quadriceps). You should see your kneecap slide up or see increased dimpling just above the knee. 3. Tighten these muscles even more as you raise your leg 4-6 inches (10-15 cm) off the floor. Do not let your knee bend. 4. Hold this position for __________ seconds. 5. Keep the thigh muscles tense as you lower your leg. 6. Relax your muscles slowly and completely after each repetition. Repeat __________ times. Complete this exercise __________ times a day. Straight leg raises, prone This is an exercise in which you stretch the muscles in the front and back of your thigh (hip extensors). 1. Lie on your abdomen (prone position) on a firm surface. You can put a pillow under your hips if that is more comfortable for your lower back. 2. Squeeze your buttocks muscles and lift your left / right leg about 4-6 inches (10-15 cm). Keep your knee straight as you lift your leg. 3. Hold this position for __________ seconds. 4. Slowly lower your leg to the starting position. 5. Let your muscles relax completely after each repetition. Repeat __________ times. Complete this exercise __________ times a day. This information is not intended to replace advice given to you by your health care provider. Make sure you discuss any questions you have with your health care provider. Document Revised: 09/12/2018  Document Reviewed: 09/12/2018 Elsevier Patient Education  2021 [employer].

## 2020-09-17 NOTE — Progress Notes (Signed)
Acute Office Visit  Subjective:    Patient ID: Victoria Mcintosh, female    DOB: Oct 04, 1980, 40 y.o.   MRN: [id number]  Chief Complaint  Patient presents with  . Joint Swelling    Right knee; starting last Friday. She denies any falls or injuries. She says that she has been using a lot more salt lately. She had Covid in January and does not fully have her taste back.    HPI Patient is in today with concerns of right knee swelling that occurred 4 days ago.  Reports that she has been standing more and doing more around the house but denies any injury.  Denies any pain today.  She has been applying ice to the affected area has not helped much.  No known history of injury to the right knee.  Past Medical History:  Diagnosis Date  . GERD (gastroesophageal reflux disease)     Past Surgical History:  Procedure Laterality Date  . CESAREAN SECTION      Family History  Problem Relation Age of Onset  . Hyperlipidemia Mother   . Hypertension Mother   . Miscarriages / [place] Mother   . Heart attack Father   . Heart disease Father   . Hyperlipidemia Father   . Hypertension Father   . Healthy Daughter   . Learning disabilities Son   . Arthritis Maternal Grandmother   . Diabetes Maternal Grandmother   . Cancer Paternal Grandfather     Social History   Socioeconomic History  . Marital status: Married    Spouse name: Not on file  . Number of children: 2  . Years of education: Not on file  . Highest education level: Not on file  Occupational History  . 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 Smoking status: Former Smoker    Packs/day: 0.25    Years: 2.00    Pack years: 0.50    Types: Cigarettes    Quit date: 03/1999    Years since quitting: 21.5  . Smokeless tobacco: Never Used  Vaping Use  . Vaping Use: Never used  Substance and Sexual Activity  . Alcohol use: Never  . Drug use: Never  . Sexual activity: Yes    Birth control/protection: None   Other Topics Concern  . Not on file  Social History Narrative  . Not on file   Social Determinants of Health   Financial Resource Strain: Not on file  Food Insecurity: Not on file  Transportation Needs: Not on file  Physical Activity: Not on file  Stress: Not on file  Social Connections: Not on file  Intimate Partner Violence: Not on file    Outpatient Medications Prior to Visit  Medication Sig Dispense Refill  . Ascorbic Acid (VITAMIN C) 250 MG CHEW Chew by mouth.    Marland Kitchen aspirin EC 81 MG tablet Take 81 mg by mouth daily. Swallow whole.    Marland Kitchen BLACK ELDERBERRY,BERRY-FLOWER, PO Take 1 capsule by mouth daily. Elder berry 500 mg , Vit C 400 mg  And 125 mcg Vit D3    . Coenzyme Q10 (CO Q10) 100 MG CAPS Take 1 capsule by mouth daily.    Marland Kitchen doxycycline (VIBRA-TABS) 100 MG tablet Take 1 tablet (100 mg total) by mouth 2 (two) times daily. 20 tablet 0  . Magnesium 250 MG TABS Take 1 tablet by mouth daily.    . Multiple Vitamins-Minerals (ALIVE WOMENS ENERGY) TABS Take 1 tablet by mouth daily.    Marland Kitchen  TURMERIC PO Take 1,000 mg by mouth daily.    Marland Kitchen zinc gluconate 50 MG tablet Take 50 mg by mouth daily.     No facility-administered medications prior to visit.    No Known Allergies  Review of Systems  Constitutional: Negative.   Respiratory: Negative.   Cardiovascular: Negative.   Musculoskeletal: Positive for joint swelling. Negative for arthralgias.       Right knee bursitis  Skin: Negative.   Allergic/Immunologic: Negative.   Neurological: Negative.   Psychiatric/Behavioral: Negative.   All other systems reviewed and are negative.      Objective:    Physical Exam Vitals reviewed.  Constitutional:      Appearance: Normal appearance.  Cardiovascular:     Rate and Rhythm: Normal rate and regular rhythm.  Pulmonary:     Effort: Pulmonary effort is normal.     Breath sounds: Normal breath sounds.  Musculoskeletal:     Cervical back: Normal range of motion and neck supple.      Comments: Effusion noted to the right knee.  Informed consent was obtained.  Under sterile technique, 1 cc of lidocaine was used to anesthetize the area of bursitis with unsuccessful aspiration.  Patient tolerated the procedure well.  Dressed and bandaged surgically and with an Ace wrap.  Skin:    General: Skin is warm and dry.  Neurological:     General: No focal deficit present.     Mental Status: She is alert and oriented to person, place, and time.  Psychiatric:        Mood and Affect: Mood normal.        Behavior: Behavior normal.     BP 100/70   Pulse 82   Temp 98.4 F (36.9 C) (Temporal)   Ht 4' 9.5" (1.461 m)   Wt 113 lb 6.4 oz (51.4 kg)   SpO2 99%   BMI 24.11 kg/m  Wt Readings from Last 3 Encounters:  09/17/20 113 lb 6.4 oz (51.4 kg)  03/11/20 114 lb 6.4 oz (51.9 kg)  03/04/20 114 lb (51.7 kg)    Health Maintenance Due  Topic Date Due  . Hepatitis C Screening  Never done  . TETANUS/TDAP  05/01/2020  . PAP SMEAR-Modifier  10/05/2020    There are no preventive care reminders to display for this patient.   Lab Results  Component Value Date   TSH 2.54 11/07/2018   Lab Results  Component Value Date   WBC 7.6 03/11/2020   HGB 13.1 03/11/2020   HCT 38.3 03/11/2020   MCV 92.1 03/11/2020   PLT 350 03/11/2020   Lab Results  Component Value Date   NA 138 11/07/2018   K 3.6 11/07/2018   CO2 28 11/07/2018   GLUCOSE 72 11/07/2018   BUN 11 11/07/2018   CREATININE 0.81 11/07/2018   BILITOT 0.5 11/07/2018   ALKPHOS 32 (L) 11/07/2018   AST 13 11/07/2018   ALT 11 11/07/2018   PROT 6.7 11/07/2018   ALBUMIN 4.5 11/07/2018   CALCIUM 9.2 11/07/2018   GFR 78.91 11/07/2018   Lab Results  Component Value Date   CHOL 161 11/07/2018   Lab Results  Component Value Date   HDL 60.20 11/07/2018   Lab Results  Component Value Date   LDLCALC 81 11/07/2018   Lab Results  Component Value Date   TRIG 101.0 11/07/2018   Lab Results  Component Value Date    CHOLHDL 3 11/07/2018   No results found for: HGBA1C  Assessment & Plan:   Problem List Items Addressed This Visit   None   Visit Diagnoses    Prepatellar bursitis of right knee    -  Primary   Relevant Orders   Ambulatory referral to Sports Medicine     Advised patient that we will do a referral to sports medicine to evaluate the bursitis.  Ice, rest, Ace wrap and elevation of the right knee advised.  Call the office if symptoms worsen or persist.  See sports medicine as scheduled.  No orders of the defined types were placed in this encounter.    Eulis Foster, FNP

## 2020-09-23 ENCOUNTER — Other Ambulatory Visit: Payer: Self-pay

## 2020-09-23 ENCOUNTER — Encounter: Payer: Self-pay | Admitting: Family Medicine

## 2020-09-23 ENCOUNTER — Other Ambulatory Visit (HOSPITAL_COMMUNITY)
Admission: RE | Admit: 2020-09-23 | Discharge: 2020-09-23 | Disposition: A | Discharge status: Home / Self Care | Payer: Managed Care, Other (non HMO) | Source: Ambulatory Visit | Attending: Family Medicine | Admitting: Family Medicine

## 2020-09-23 ENCOUNTER — Ambulatory Visit (INDEPENDENT_AMBULATORY_CARE_PROVIDER_SITE_OTHER): Payer: Managed Care, Other (non HMO) | Admitting: Family Medicine

## 2020-09-23 VITALS — BP 110/80 | HR 81 | Temp 97.8°F | Resp 15 | Ht <= 58 in | Wt 112.4 lb

## 2020-09-23 DIAGNOSIS — J309 Allergic rhinitis, unspecified: Secondary | ICD-10-CM

## 2020-09-23 DIAGNOSIS — Z0000 Encounter for general adult medical examination without abnormal findings: Secondary | ICD-10-CM

## 2020-09-23 DIAGNOSIS — M25561 Pain in right knee: Secondary | ICD-10-CM | POA: Diagnosis not present

## 2020-09-23 DIAGNOSIS — Z124 Encounter for screening for malignant neoplasm of cervix: Secondary | ICD-10-CM | POA: Insufficient documentation

## 2020-09-23 DIAGNOSIS — N6012 Diffuse cystic mastopathy of left breast: Secondary | ICD-10-CM

## 2020-09-23 DIAGNOSIS — Z23 Encounter for immunization: Secondary | ICD-10-CM

## 2020-09-23 DIAGNOSIS — N6011 Diffuse cystic mastopathy of right breast: Secondary | ICD-10-CM

## 2020-09-23 LAB — COMPREHENSIVE METABOLIC PANEL
ALT: 13 U/L (ref 0–35)
AST: 15 U/L (ref 0–37)
Albumin: 4.3 g/dL (ref 3.5–5.2)
Alkaline Phosphatase: 33 U/L — ABNORMAL LOW (ref 39–117)
BUN: 10 mg/dL (ref 6–23)
CO2: 26 mEq/L (ref 19–32)
Calcium: 9.6 mg/dL (ref 8.4–10.5)
Chloride: 100 mEq/L (ref 96–112)
Creatinine, Ser: 0.78 mg/dL (ref 0.40–1.20)
GFR: 95.02 mL/min (ref >60.00)
Glucose, Bld: 76 mg/dL (ref 70–99)
Potassium: 3.6 mEq/L (ref 3.5–5.1)
Sodium: 136 mEq/L (ref 135–145)
Total Bilirubin: 0.6 mg/dL (ref 0.2–1.2)
Total Protein: 7 g/dL (ref 6.0–8.3)

## 2020-09-23 LAB — CBC WITH DIFFERENTIAL/PLATELET
Basophils Absolute: 0 10*3/uL (ref 0.0–0.1)
Basophils Relative: 0.6 % (ref 0.0–3.0)
Eosinophils Absolute: 0.1 10*3/uL (ref 0.0–0.7)
Eosinophils Relative: 1 % (ref 0.0–5.0)
HCT: 39.8 % (ref 36.0–46.0)
Hemoglobin: 13.8 g/dL (ref 12.0–15.0)
Lymphocytes Relative: 19.5 % (ref 12.0–46.0)
Lymphs Abs: 1.6 10*3/uL (ref 0.7–4.0)
MCHC: 34.8 g/dL (ref 30.0–36.0)
MCV: 90.4 fl (ref 78.0–100.0)
Monocytes Absolute: 0.5 10*3/uL (ref 0.1–1.0)
Monocytes Relative: 6.1 % (ref 3.0–12.0)
Neutro Abs: 6 10*3/uL (ref 1.4–7.7)
Neutrophils Relative %: 72.8 % (ref 43.0–77.0)
Platelets: 338 10*3/uL (ref 150.0–400.0)
RBC: 4.4 Mil/uL (ref 3.87–5.11)
RDW: 12.1 % (ref 11.5–15.5)
WBC: 8.3 10*3/uL (ref 4.0–10.5)

## 2020-09-23 LAB — LIPID PANEL
Cholesterol: 155 mg/dL (ref 0–200)
HDL: 56.4 mg/dL (ref >39.00)
LDL Cholesterol: 85 mg/dL (ref 0–99)
NonHDL: 99.03
Total CHOL/HDL Ratio: 3
Triglycerides: 69 mg/dL (ref 0.0–149.0)
VLDL: 13.8 mg/dL (ref 0.0–40.0)

## 2020-09-23 NOTE — Patient Instructions (Signed)
Please return in 12 months for your annual complete physical; please come fasting.  I will release your lab results to you on your MyChart account with further instructions. Please reply with any questions.   Today you were given your Tdap vaccination.   If you have any questions or concerns, please don't hesitate to send me a message via MyChart or call the office at [phone]. Thank you for visiting with [place] today! It's our pleasure caring for you.  Please do these things to maintain good health!   Exercise at least 30-45 minutes a day,  4-5 days a week.   Eat a low-fat diet with lots of fruits and vegetables, up to 7-9 servings per day.  Drink plenty of water daily. Try to drink 8 8oz glasses per day.  Seatbelts can save your life. Always wear your seatbelt.  Place Smoke Detectors on every level of your home and check batteries every year.  Schedule an appointment with an eye doctor for an eye exam every 1-2 years  Safe sex - use condoms to protect yourself from STDs if you could be exposed to these types of infections. Use birth control if you do not want to become pregnant and are sexually active.  Avoid heavy alcohol use. If you drink, keep it to less than 2 drinks/day and not every day.  Health Care Power of Attorney.  Choose someone you trust that could speak for you if you became unable to speak for yourself.  Depression is common in our stressful world.If you're feeling down or losing interest in things you normally enjoy, please come in for a visit.  If anyone is threatening or hurting you, please get help. Physical or Emotional Violence is never OK.

## 2020-09-23 NOTE — Progress Notes (Signed)
Subjective  Chief Complaint  Patient presents with  . Annual Exam    Fasting  . Knee Pain    Right knee pain, was swollen starting 2 weeks ago.    HPI: Victoria Mcintosh is a 40 y.o. female who presents to [facility] [place] today for a Female Wellness Visit. She also has the concerns and/or needs as listed above in the chief complaint. These will be addressed in addition to the Health Maintenance Visit.   Wellness Visit: annual visit with health maintenance review and exam with Pap   Health maintenance: Last physical was in 2020.  Reports she is stable.  Due Pap smear.  Eligible for mammogram.  Average risk.  Due Tdap. Chronic disease f/u and/or acute problem visit: (deemed necessary to be done in addition to the wellness visit):  Right knee pain: Reviewed note from last week.  Patient was diagnosed with prepatellar bursitis but states that had an infusion.  Patient states she received a steroid injection however this is not clear in documentation.  She reports swelling has decreased.  She is not having pain.  No injury.  She was referred to sports medicine.  No appointment has been made yet.  She has not taken any pain medicine.  No locking or give way.  Allergies are well controlled.  Assessment  1. Annual physical exam   2. Chronic allergic rhinitis   3. Cervical cancer screening   4. Acute pain of right knee   5. Fibrocystic breast changes of both breasts      Plan  Female Wellness Visit:  Age appropriate Health Maintenance and Prevention measures were discussed with patient. Included topics are cancer screening recommendations, ways to keep healthy (see AVS) including dietary and exercise recommendations, regular eye and dental care, use of seat belts, and avoidance of moderate alcohol use and tobacco use.  Discussed options of breast cancer screening.  Will defer mammogram for at least 1 year.  Can discuss again next year.  Pap smear with HPV testing done  today.  BMI: discussed patient's BMI and encouraged positive lifestyle modifications to help get to or maintain a target BMI.  HM needs and immunizations were addressed and ordered. See below for orders. See HM and immunization section for updates.  Tdap today  Routine labs and screening tests ordered including cmp, cbc and lipids where appropriate.  Discussed recommendations regarding Vit D and calcium supplementation (see AVS)  Chronic disease management visit and/or acute problem visit:  Right knee swelling: Apparently had effusion.  Minimal now.  No pain.  Has referral to sports medicine.  Improving.  Most  Fibrocystic breast changes   Follow up: No follow-ups on file.  Orders Placed This Encounter  Procedures  . CBC with Differential/Platelet  . Comprehensive metabolic panel  . Lipid panel  . Hepatitis C antibody   No orders of the defined types were placed in this encounter.     Body mass index is 23.49 kg/m. Wt Readings from Last 3 Encounters:  09/23/20 112 lb 6.4 oz (51 kg)  09/17/20 113 lb 6.4 oz (51.4 kg)  03/11/20 114 lb 6.4 oz (51.9 kg)     Patient Active Problem List   Diagnosis Date Noted  . Fibrocystic breast changes of both breasts 09/23/2020  . Chronic allergic rhinitis 11/07/2018  . Chronic cough 10/11/2017   Health Maintenance  Topic Date Due  . Hepatitis C Screening  Never done  . TETANUS/TDAP  05/01/2020  .  PAP SMEAR-Modifier  10/05/2020  . COVID-19 Vaccine (1) 10/03/2020 (Originally 04/30/1986)  . INFLUENZA VACCINE  12/30/2020  . HIV Screening  Completed  . HPV VACCINES  Aged Out    There is no immunization history on file for this patient. We updated and reviewed the patient's past history in detail and it is documented below. Allergies: Patient has No Known Allergies. Past Medical History Patient  has a past medical history of GERD (gastroesophageal reflux disease). Past Surgical History Patient  has a past surgical history that  includes Cesarean section. Family History: Patient family history includes Arthritis in her maternal grandmother; Cancer in her paternal grandfather; Diabetes in her maternal grandmother; Healthy in her daughter; Heart attack in her father; Heart disease in her father; Hyperlipidemia in her father and mother; Hypertension in her father and mother; Learning disabilities in her son; Miscarriages / [place] in her mother. Social History:  Patient  reports that she quit smoking about 21 years ago. Her smoking use included cigarettes. She has a 0.50 pack-year smoking history. She has never used smokeless tobacco. She reports that she does not drink alcohol and does not use drugs.  Review of Systems: Constitutional: negative for fever or malaise Ophthalmic: negative for photophobia, double vision or loss of vision Cardiovascular: negative for chest pain, dyspnea on exertion, or new LE swelling Respiratory: negative for SOB or persistent cough Gastrointestinal: negative for abdominal pain, change in bowel habits or melena Genitourinary: negative for dysuria or gross hematuria, no abnormal uterine bleeding or disharge Musculoskeletal: negative for new gait disturbance or muscular weakness Integumentary: negative for new or persistent rashes, no breast lumps Neurological: negative for TIA or stroke symptoms Psychiatric: negative for SI or delusions Allergic/Immunologic: negative for hives  Patient Care Team    Relationship Specialty Notifications Start End  Willow Ora, MD PCP - General Family Medicine  10/11/17   Zelphia Cairo, MD Consulting Physician Obstetrics and Gynecology  10/11/17   Grant Fontana, MD Referring Physician Ophthalmology  10/11/17     Objective  Vitals: BP 110/80   Pulse 81   Temp 97.8 F (36.6 C) (Temporal)   Resp 15   Ht 4\' 10"  (1.473 m)   Wt 112 lb 6.4 oz (51 kg)   LMP 09/15/2020 (Approximate)   SpO2 98%   BMI 23.49 kg/m  General:  Well developed, well  nourished, no acute distress  Psych:  Alert and orientedx3,normal mood and affect HEENT:  Normocephalic, atraumatic, non-icteric sclera,  supple neck without adenopathy, mass or thyromegaly Cardiovascular:  Normal S1, S2, RRR without gallop, rub or murmur Respiratory:  Good breath sounds bilaterally, CTAB with normal respiratory effort Gastrointestinal: normal bowel sounds, soft, non-tender, no noted masses. No HSM MSK: no deformities, contusions. Joints are without erythema or swelling. Right knee w/o ttp, neg mcurrays, neg lachmans. No ttp over prepatellar bursa, from.  Skin:  Warm, no rashes or suspicious lesions noted Neurologic:    Mental status is normal. CN 2-11 are normal. Gross motor and sensory exams are normal. Normal gait. No tremor Breast Exam: No mass, skin retraction or nipple discharge is appreciated in either breast. No axillary adenopathy. Fibrocystic changes are noted Pelvic Exam: Normal external genitalia, no vulvar or vaginal lesions present. Clear cervix w/o CMT. Bimanual exam reveals a nontender fundus w/o masses, nl size. No adnexal masses present. No inguinal adenopathy. A PAP smear was performed.    Commons side effects, risks, benefits, and alternatives for medications and treatment plan prescribed today were discussed,  and the patient expressed understanding of the given instructions. Patient is instructed to call or message via MyChart if he/she has any questions or concerns regarding our treatment plan. No barriers to understanding were identified. We discussed Red Flag symptoms and signs in detail. Patient expressed understanding regarding what to do in case of urgent or emergency type symptoms.   Medication list was reconciled, printed and provided to the patient in AVS. Patient instructions and summary information was reviewed with the patient as documented in the AVS. This note was prepared with assistance of Dragon voice recognition software. Occasional wrong-word  or sound-a-like substitutions may have occurred due to the inherent limitations of voice recognition software  This visit occurred during the SARS-CoV-2 public health emergency.  Safety protocols were in place, including screening questions prior to the visit, additional usage of staff PPE, and extensive cleaning of exam room while observing appropriate contact time as indicated for disinfecting solutions.

## 2020-09-24 LAB — HEPATITIS C ANTIBODY
Hepatitis C Ab: NONREACTIVE
SIGNAL TO CUT-OFF: 0 (ref <1.00)

## 2020-09-25 LAB — CYTOLOGY - PAP
Comment: NEGATIVE
Diagnosis: UNDETERMINED — AB
High risk HPV: NEGATIVE

## 2020-09-26 ENCOUNTER — Encounter: Payer: Self-pay | Admitting: Family Medicine

## 2020-10-01 ENCOUNTER — Encounter: Payer: Self-pay | Admitting: Family Medicine

## 2020-10-01 DIAGNOSIS — R87610 Atypical squamous cells of undetermined significance on cytologic smear of cervix (ASC-US): Secondary | ICD-10-CM | POA: Insufficient documentation

## 2020-10-02 ENCOUNTER — Telehealth: Payer: Self-pay

## 2020-10-02 NOTE — Telephone Encounter (Signed)
Pt returned Victoria Mcintosh's call about lab results. Please advise.

## 2020-10-03 NOTE — Telephone Encounter (Signed)
Returned patients call, left a voicemail to return call

## 2020-10-04 NOTE — Telephone Encounter (Signed)
Spoke with patient regarding labs, gave verbal understanding

## 2020-11-05 ENCOUNTER — Encounter: Payer: Self-pay | Admitting: Family Medicine

## 2020-11-12 NOTE — Progress Notes (Signed)
I, Philbert Riser, LAT, ATC acting as a scribe for [name], MD.  Subjective:    I'm seeing this patient as a consultation for: Worthy Rancher, FNP. Note will be routed back to referring provider/PCP.  CC: Right knee pain  HPI: Pt is a 40 y/o female c/o R knee pain ongoing since 09/13/20 w/ no known MOI or trauma. Pt reports she had been doing more around the house and standing more, but can't recall a specific mechanism. Pt was seen by PCP on 09/17/20 and the nurse practitioner performed and unsuccessful knee aspiration. It is unclear in the documentation if a steroid injection was performed. Today, pt reports she is not really having pain, but will intermittently experience swelling on the anterior and posterior aspect.  R knee swelling: yes Mechanical symptoms: yes Aggravates: standing, resting w/ knees flexed Treatments tried: ice, rest, ace wrap  Additionally, pt c/o R shoulder pain ongoing for 9 months. MOI: Pt slept w/ her hand under her pillow and then woke up w/ shoulder pain that has not resolved. Pt locates pain to the anterior aspect of R shoulder. No radiating or numbness/tingling noted. Pt is R-hand dominate. Pt notes increased pain w/ shoulder ABD.   Past medical history, Surgical history, Family history, Social history, Allergies, and medications have been entered into the medical record, reviewed.   Review of Systems: No new headache, visual changes, nausea, vomiting, diarrhea, constipation, dizziness, abdominal pain, skin rash, fevers, chills, night sweats, weight loss, swollen lymph nodes, body aches, joint swelling, muscle aches, chest pain, shortness of breath, mood changes, visual or auditory hallucinations.   Objective:    Vitals:   11/13/20 0952  BP: 104/74  Pulse: 93  SpO2: 98%   General: Well Developed, well nourished, and in no acute distress.  Neuro/Psych: Alert and oriented x3, extra-ocular muscles intact, able to move all 4 extremities, sensation grossly  intact. Skin: Warm and dry, no rashes noted.  Respiratory: Not using accessory muscles, speaking in full sentences, trachea midline.  Cardiovascular: Pulses palpable, no extremity edema. Abdomen: Does not appear distended. MSK: Right knee mild effusion normal-appearing otherwise. Normal motion without crepitation. Tender palpation medial joint line. Stable ligamentous exam.  Negative Murray's test. Intact strength.  Right shoulder normal-appearing nontender  normal motion pain with abduction.   Positive Hawkins and Neer's test positive empty can test Intact strength.   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 Await formal radiology review  Procedure: Real-time Ultrasound Guided Injection of right knee superior lateral patellar space Device: Philips Affiniti 50G Images permanently stored and available for review in PACS Ultrasound evaluation prior to injection reveals moderate joint effusion.  Intact lateral medial meniscus.  Tiny Baker's cyst present. Verbal informed consent obtained.  Discussed risks and benefits of procedure. Warned about infection bleeding damage to structures skin hypopigmentation and fat atrophy among others. Patient expresses understanding and agreement Time-out conducted.   Noted no overlying erythema, induration, or other signs of local infection.   Skin prepped in a sterile fashion.   Local anesthesia: Topical Ethyl chloride.   With sterile technique and under real time ultrasound guidance: 40 mg of Kenalog and 2 mL of Marcaine injected into right knee. Fluid seen entering the joint capsule.   Completed without difficulty   Pain immediately resolved suggesting accurate placement of the medication.   Advised to call if fevers/chills, erythema, induration, drainage, or persistent bleeding.   Images permanently  stored and available for review in the ultrasound  unit.  Impression: Technically successful ultrasound guided injection.      Impression and Recommendations:    Assessment and Plan: 40 y.o. female with right knee pain with joint effusion.  Etiology is somewhat unclear.  X-ray does show some mild degenerative changes.  However she has more pain and joint effusion than would be expected at this time.  Plan for injection Voltaren gel compression knee sleeve and trial of physical therapy.  Recheck in 6 weeks.  If not improved would consider MRI to further characterize cause of pain..  Right shoulder pain thought to be rotator cuff tendinopathy and impingement.  Again plan for physical therapy.  Reassess in 6 weeks.  PDMP not reviewed this encounter. Orders Placed This Encounter  Procedures   [place] LIMITED JOINT SPACE STRUCTURES LOW RIGHT(NO LINKED CHARGES)    Standing Status:   Future    Number of Occurrences:   1    Standing Expiration Date:   05/15/2021    Order Specific Question:   Reason for Exam (SYMPTOM  OR DIAGNOSIS REQUIRED)    Answer:   right knee pain    Order Specific Question:   Preferred imaging location?    Answer:    [facility] Knee AP/LAT W/[place] Right    Standing Status:   Future    Number of Occurrences:   1    Standing Expiration Date:   11/13/2021    Order Specific Question:   Reason for Exam (SYMPTOM  OR DIAGNOSIS REQUIRED)    Answer:   eval knee pain    Order Specific Question:   Is patient pregnant?    Answer:   No    Order Specific Question:   Preferred imaging location?    Answer:   [facility]   Ambulatory referral to Physical Therapy    Referral Priority:   Routine    Referral Type:   Physical Medicine    Referral Reason:   Specialty Services Required    Requested Specialty:   Physical Therapy    Number of Visits Requested:   1   No orders of the defined types were placed in this encounter.   Discussed warning signs or symptoms. Please see discharge  instructions. Patient expresses understanding.   The above documentation has been reviewed and is accurate and complete [name], M.D.

## 2020-11-13 ENCOUNTER — Encounter: Payer: Self-pay | Admitting: Family Medicine

## 2020-11-13 ENCOUNTER — Ambulatory Visit: Payer: Self-pay

## 2020-11-13 ENCOUNTER — Ambulatory Visit (INDEPENDENT_AMBULATORY_CARE_PROVIDER_SITE_OTHER): Payer: Managed Care, Other (non HMO) | Admitting: Family Medicine

## 2020-11-13 ENCOUNTER — Other Ambulatory Visit: Payer: Self-pay

## 2020-11-13 ENCOUNTER — Ambulatory Visit (INDEPENDENT_AMBULATORY_CARE_PROVIDER_SITE_OTHER): Payer: Managed Care, Other (non HMO)

## 2020-11-13 VITALS — BP 104/74 | HR 93 | Ht <= 58 in | Wt 112.4 lb

## 2020-11-13 DIAGNOSIS — M25561 Pain in right knee: Secondary | ICD-10-CM | POA: Diagnosis not present

## 2020-11-13 DIAGNOSIS — M25511 Pain in right shoulder: Secondary | ICD-10-CM

## 2020-11-13 NOTE — Patient Instructions (Signed)
Thank you for coming in today.   Call or go to the ER if you develop a large red swollen joint with extreme pain or oozing puss.    Please get an Xray today before you leave   I've referred you to Physical Therapy.  Let us know if you don't hear from them in one week.    

## 2020-11-14 NOTE — Progress Notes (Signed)
Right knee x-ray shows some swelling in the joint but otherwise looks normal to radiology.

## 2020-11-25 ENCOUNTER — Other Ambulatory Visit: Payer: Self-pay

## 2020-11-25 ENCOUNTER — Ambulatory Visit (INDEPENDENT_AMBULATORY_CARE_PROVIDER_SITE_OTHER): Payer: Managed Care, Other (non HMO) | Admitting: Family Medicine

## 2020-11-25 ENCOUNTER — Encounter: Payer: Self-pay | Admitting: Family Medicine

## 2020-11-25 VITALS — BP 118/66 | HR 70 | Temp 98.2°F | Resp 15 | Ht <= 58 in | Wt 115.3 lb

## 2020-11-25 DIAGNOSIS — M545 Low back pain, unspecified: Secondary | ICD-10-CM | POA: Diagnosis not present

## 2020-11-25 DIAGNOSIS — R350 Frequency of micturition: Secondary | ICD-10-CM | POA: Diagnosis not present

## 2020-11-25 LAB — POCT URINALYSIS DIP (MANUAL ENTRY)
Bilirubin, UA: NEGATIVE
Blood, UA: NEGATIVE
Glucose, UA: NEGATIVE mg/dL
Ketones, POC UA: NEGATIVE mg/dL
Leukocytes, UA: NEGATIVE
Nitrite, UA: NEGATIVE
Protein Ur, POC: NEGATIVE mg/dL
Spec Grav, UA: 1.015 (ref 1.010–1.025)
Urobilinogen, UA: 0.2 E.U./dL
pH, UA: 5 (ref 5.0–8.0)

## 2020-11-25 MED ORDER — MELOXICAM 7.5 MG PO TABS: 7.5000 mg | ORAL_TABLET | Freq: Every day | ORAL | 0 refills | Status: DC | PRN

## 2020-11-25 NOTE — Patient Instructions (Signed)
See information below on back pain.  I suspect you either pulled a muscle or overused the muscle with kayaking but expect that pain to improve soon.  Can try meloxicam 1 pill/day for the next week or 2.  Do not combine that with over-the-counter pain medicine other than Tylenol.  If not improving in the next week or 2, follow-up with myself or Dr. Mardelle Matte to decide on other testing or treatment.  Sooner if worse.  Take care.   Acute Back Pain, Adult Acute back pain is sudden and usually short-lived. It is often caused by an injury to the muscles and tissues in the back. The injury may result from: A muscle or ligament getting overstretched or torn (strained). Ligaments are tissues that connect bones to each other. Lifting something improperly can cause a back strain. Wear and tear (degeneration) of the spinal disks. Spinal disks are circular tissue that provide cushioning between the bones of the spine (vertebrae). Twisting motions, such as while playing sports or doing yard work. A hit to the back. Arthritis. You may have a physical exam, lab tests, and imaging tests to find the cause ofyour pain. Acute back pain usually goes away with rest and home care. Follow these instructions at home: Managing pain, stiffness, and swelling Treatment may include medicines for pain and inflammation that are taken by mouth or applied to the skin, prescription pain medicine, or muscle relaxants. Take over-the-counter and prescription medicines only as told by your health care provider. Your health care provider may recommend applying ice during the first 24-48 hours after your pain starts. To do this: Put ice in a plastic bag. Place a towel between your skin and the bag. Leave the ice on for 20 minutes, 2-3 times a day. If directed, apply heat to the affected area as often as told by your health care provider. Use the heat source that your health care provider recommends, such as a moist heat pack or a heating  pad. Place a towel between your skin and the heat source. Leave the heat on for 20-30 minutes. Remove the heat if your skin turns bright red. This is especially important if you are unable to feel pain, heat, or cold. You have a greater risk of getting burned. Activity  Do not stay in bed. Staying in bed for more than 1-2 days can delay your recovery. Sit up and stand up straight. Avoid leaning forward when you sit or hunching over when you stand. If you work at a desk, sit close to it so you do not need to lean over. Keep your chin tucked in. Keep your neck drawn back, and keep your elbows bent at a 90-degree angle (right angle). Sit high and close to the steering wheel when you drive. Add lower back (lumbar) support to your car seat, if needed. Take short walks on even surfaces as soon as you are able. Try to increase the length of time you walk each day. Do not sit, drive, or stand in one place for more than 30 minutes at a time. Sitting or standing for long periods of time can put stress on your back. Do not drive or use heavy machinery while taking prescription pain medicine. Use proper lifting techniques. When you bend and lift, use positions that put less stress on your back: [place] your knees. Keep the load close to your body. Avoid twisting. Exercise regularly as told by your health care provider. Exercising helps your back heal faster and helps prevent  back injuries by keeping muscles strong and flexible. Work with a physical therapist to make a safe exercise program, as recommended by your health care provider. Do any exercises as told by your physical therapist.  Lifestyle Maintain a healthy weight. Extra weight puts stress on your back and makes it difficult to have good posture. Avoid activities or situations that make you feel anxious or stressed. Stress and anxiety increase muscle tension and can make back pain worse. Learn ways to manage anxiety and stress, such as through  exercise. General instructions Sleep on a firm mattress in a comfortable position. Try lying on your side with your knees slightly bent. If you lie on your back, put a pillow under your knees. Follow your treatment plan as told by your health care provider. This may include: Cognitive or behavioral therapy. Acupuncture or massage therapy. Meditation or yoga. Contact a health care provider if: You have pain that is not relieved with rest or medicine. You have increasing pain going down into your legs or buttocks. Your pain does not improve after 2 weeks. You have pain at night. You lose weight without trying. You have a fever or chills. Get help right away if: You develop new bowel or bladder control problems. You have unusual weakness or numbness in your arms or legs. You develop nausea or vomiting. You develop abdominal pain. You feel faint. Summary Acute back pain is sudden and usually short-lived. Use proper lifting techniques. When you bend and lift, use positions that put less stress on your back. Take over-the-counter and prescription medicines and apply heat or ice as directed by your health care provider. This information is not intended to replace advice given to you by your health care provider. Make sure you discuss any questions you have with your healthcare provider. Document Revised: 02/06/2020 Document Reviewed: 02/09/2020 Elsevier Patient Education  2022 [employer].

## 2020-11-25 NOTE — Progress Notes (Signed)
Subjective:  Patient ID: Victoria Mcintosh, female    DOB: 09-21-1980  Age: 40 y.o. MRN: [id number]  CC:  Chief Complaint  Patient presents with   Back Pain    Pt reports lower Rt side back pain reports worse at night comes and goes at night, pt denies burning with urination and other urinary sxs, started on Monday 1 week ago     HPI Victoria Mcintosh presents for   R sided back pain: Noticed pain in R lower back when waking up 1 week ago. Notes in am when wakes up, more intense at times, comes and goes. Ache at times, then improves.  No fall/injury or one specific preceding event. Did kayak day prior to soreness starting.  No abdominal pain, episodic nausea- not new. No vomiting, no worsening of chronic nausea. No dysuria/urgency/hematuria. No personal hx of nephrolithiasis. Mild increase frequency, but some increase in fluid intake.  No rash, no fever, no new cough. No dyspnea.  Tx: tylenol - works well - once or twice.  No hx of PUD, or kidney disease.  No bowel or bladder incontinence, no saddle anesthesia, no lower extremity weakness.   History Patient Active Problem List   Diagnosis Date Noted   ASCUS of cervix with negative high risk HPV 10/01/2020   Fibrocystic breast changes of both breasts 09/23/2020   Chronic allergic rhinitis 11/07/2018   Chronic cough 10/11/2017   Past Medical History:  Diagnosis Date   GERD (gastroesophageal reflux disease)    Past Surgical History:  Procedure Laterality Date   CESAREAN SECTION     No Known Allergies Prior to Admission medications   Medication Sig Start Date End Date Taking? Authorizing Provider  Ascorbic Acid (VITAMIN C) 250 MG CHEW Chew by mouth.   Yes [provider]  BLACK ELDERBERRY,BERRY-FLOWER, PO Take 1 capsule by mouth daily. Elder berry 500 mg , Vit C 400 mg  And 125 mcg Vit D3   Yes [provider]  Coenzyme Q10 (CO Q10) 100 MG CAPS Take 1 capsule by mouth daily.   Yes [provider]   Magnesium 250 MG TABS Take 1 tablet by mouth daily.   Yes [provider]  Multiple Vitamins-Minerals (ALIVE WOMENS ENERGY) TABS Take 1 tablet by mouth daily.   Yes [provider]  TURMERIC PO Take 1,000 mg by mouth daily.   Yes [provider]  zinc gluconate 50 MG tablet Take 50 mg by mouth daily.   Yes [provider]   Social History   Socioeconomic History   Marital status: Married    Spouse name: Not on file   Number of children: 2   Years of education: Not on file   Highest education level: Not on file  Occupational History   Occupation: Stay at Home mom; home schools  Tobacco Use   Smoking status: Former    Packs/day: 0.25    Years: 2.00    Pack years: 0.50    Types: Cigarettes    Quit date: 03/1999    Years since quitting: 21.7   Smokeless tobacco: Never  Vaping Use   Vaping Use: Never used  Substance and Sexual Activity   Alcohol use: Never   Drug use: Never   Sexual activity: Yes    Birth control/protection: None  Other Topics Concern   Not on file  Social History Narrative   Not on file   Social Determinants of Health   Financial Resource Strain: Not on file  Food Insecurity: Not on file  Transportation Needs: Not on file  Physical Activity: Not on file  Stress: Not on file  Social Connections: Not on file  Intimate Partner Violence: Not on file    Review of Systems per HPI.   Objective:   Vitals:   11/25/20 1613  BP: 118/66  Pulse: 70  Resp: 15  Temp: 98.2 F (36.8 C)  TempSrc: Temporal  SpO2: 97%  Weight: 115 lb 4.8 oz (52.3 kg)  Height: 4\' 10"  (1.473 m)     Physical Exam Constitutional:      General: She is not in acute distress.    Appearance: Normal appearance. She is well-developed.  HENT:     Head: Normocephalic and atraumatic.  Cardiovascular:     Rate and Rhythm: Normal rate.  Pulmonary:     Effort: Pulmonary effort is normal.  Abdominal:     General: There is no distension.      Tenderness: There is no abdominal tenderness. There is no right CVA tenderness, left CVA tenderness or guarding.  Musculoskeletal:     Comments: Lumbar spine Full range of motion, minimal discomfort on the right paraspinals with left lateral flexion.  Pain-free flexion, rotation, extension.  Seated straight leg raise was negative.  Reflexes 2+ at patella, Achilles.  Able to heel and toe walk without difficulty, normal gait.  Normal discomfort with palpation of the right paraspinals with possible minimal spasm.  Skin:    General: Skin is warm.     Findings: No rash.  Neurological:     General: No focal deficit present.     Mental Status: She is alert and oriented to person, place, and time.  Psychiatric:        Mood and Affect: Mood normal.        Behavior: Behavior normal.     Results for orders placed or performed in visit on 11/25/20  POCT urinalysis dipstick  Result Value Ref Range   Color, UA light yellow (A) yellow   Clarity, UA clear clear   Glucose, UA negative negative mg/dL   Bilirubin, UA negative negative   Ketones, POC UA negative negative mg/dL   Spec Grav, UA 11/27/20 5.102 - 1.025   Blood, UA negative negative   pH, UA 5.0 5.0 - 8.0   Protein Ur, POC negative negative mg/dL   Urobilinogen, UA 0.2 0.2 or 1.0 E.U./dL   Nitrite, UA Negative Negative   Leukocytes, UA Negative Negative     Assessment & Plan:  Victoria Mcintosh is a 40 y.o. female . Acute right-sided low back pain without sciatica - Plan: meloxicam (MOBIC) 7.5 MG tablet  Urinary frequency - Plan: POCT urinalysis dipstick Suspected strain/overuse of right paraspinals with kayaking day prior.  urinary frequency likely from increased fluid intake but no sign of urinary tract infection based on testing or other symptoms.  No red flags on exam or history.   -Symptomatic treatment discussed with meloxicam 7.5 mg daily for the next 1 to 2 weeks, heat or ice if needed, range of motion, stretches and handout given.   RTC precautions given if not improving, sooner if worse.  No orders of the defined types were placed in this encounter.  Patient Instructions  See information below on back pain.  I suspect you either pulled a muscle or overused the muscle with kayaking but expect that pain to improve soon.  Can try meloxicam 1 pill/day for the next week or 2.  Do not combine that  with over-the-counter pain medicine other than Tylenol.  If not improving in the next week or 2, follow-up with myself or Dr. Mardelle MatteAndy to decide on other testing or treatment.  Sooner if worse.  Take care.   Acute Back Pain, Adult Acute back pain is sudden and usually short-lived. It is often caused by an injury to the muscles and tissues in the back. The injury may result from: A muscle or ligament getting overstretched or torn (strained). Ligaments are tissues that connect bones to each other. Lifting something improperly can cause a back strain. Wear and tear (degeneration) of the spinal disks. Spinal disks are circular tissue that provide cushioning between the bones of the spine (vertebrae). Twisting motions, such as while playing sports or doing yard work. A hit to the back. Arthritis. You may have a physical exam, lab tests, and imaging tests to find the cause ofyour pain. Acute back pain usually goes away with rest and home care. Follow these instructions at home: Managing pain, stiffness, and swelling Treatment may include medicines for pain and inflammation that are taken by mouth or applied to the skin, prescription pain medicine, or muscle relaxants. Take over-the-counter and prescription medicines only as told by your health care provider. Your health care provider may recommend applying ice during the first 24-48 hours after your pain starts. To do this: Put ice in a plastic bag. Place a towel between your skin and the bag. Leave the ice on for 20 minutes, 2-3 times a day. If directed, apply heat to the affected area as  often as told by your health care provider. Use the heat source that your health care provider recommends, such as a moist heat pack or a heating pad. Place a towel between your skin and the heat source. Leave the heat on for 20-30 minutes. Remove the heat if your skin turns bright red. This is especially important if you are unable to feel pain, heat, or cold. You have a greater risk of getting burned. Activity  Do not stay in bed. Staying in bed for more than 1-2 days can delay your recovery. Sit up and stand up straight. Avoid leaning forward when you sit or hunching over when you stand. If you work at a desk, sit close to it so you do not need to lean over. Keep your chin tucked in. Keep your neck drawn back, and keep your elbows bent at a 90-degree angle (right angle). Sit high and close to the steering wheel when you drive. Add lower back (lumbar) support to your car seat, if needed. Take short walks on even surfaces as soon as you are able. Try to increase the length of time you walk each day. Do not sit, drive, or stand in one place for more than 30 minutes at a time. Sitting or standing for long periods of time can put stress on your back. Do not drive or use heavy machinery while taking prescription pain medicine. Use proper lifting techniques. When you bend and lift, use positions that put less stress on your back: TidiouteBend your knees. Keep the load close to your body. Avoid twisting. Exercise regularly as told by your health care provider. Exercising helps your back heal faster and helps prevent back injuries by keeping muscles strong and flexible. Work with a physical therapist to make a safe exercise program, as recommended by your health care provider. Do any exercises as told by your physical therapist.  Lifestyle Maintain a healthy weight. Extra weight puts  stress on your back and makes it difficult to have good posture. Avoid activities or situations that make you feel anxious or  stressed. Stress and anxiety increase muscle tension and can make back pain worse. Learn ways to manage anxiety and stress, such as through exercise. General instructions Sleep on a firm mattress in a comfortable position. Try lying on your side with your knees slightly bent. If you lie on your back, put a pillow under your knees. Follow your treatment plan as told by your health care provider. This may include: Cognitive or behavioral therapy. Acupuncture or massage therapy. Meditation or yoga. Contact a health care provider if: You have pain that is not relieved with rest or medicine. You have increasing pain going down into your legs or buttocks. Your pain does not improve after 2 weeks. You have pain at night. You lose weight without trying. You have a fever or chills. Get help right away if: You develop new bowel or bladder control problems. You have unusual weakness or numbness in your arms or legs. You develop nausea or vomiting. You develop abdominal pain. You feel faint. Summary Acute back pain is sudden and usually short-lived. Use proper lifting techniques. When you bend and lift, use positions that put less stress on your back. Take over-the-counter and prescription medicines and apply heat or ice as directed by your health care provider. This information is not intended to replace advice given to you by your health care provider. Make sure you discuss any questions you have with your healthcare provider. Document Revised: 02/06/2020 Document Reviewed: 02/09/2020 Elsevier Patient Education  2022 Elsevier Inc.    Signed,   Meredith Staggers, MD [facility], [facility] 11/25/20 4:52 PM

## 2021-01-01 ENCOUNTER — Ambulatory Visit: Payer: Managed Care, Other (non HMO) | Admitting: Family Medicine

## 2021-02-12 ENCOUNTER — Ambulatory Visit: Payer: Self-pay

## 2021-02-12 ENCOUNTER — Ambulatory Visit (INDEPENDENT_AMBULATORY_CARE_PROVIDER_SITE_OTHER): Payer: Managed Care, Other (non HMO) | Admitting: Family Medicine

## 2021-02-12 ENCOUNTER — Other Ambulatory Visit: Payer: Self-pay

## 2021-02-12 VITALS — BP 124/80 | HR 86 | Ht <= 58 in | Wt 112.8 lb

## 2021-02-12 DIAGNOSIS — M25461 Effusion, right knee: Secondary | ICD-10-CM

## 2021-02-12 DIAGNOSIS — M25561 Pain in right knee: Secondary | ICD-10-CM

## 2021-02-12 LAB — SEDIMENTATION RATE: Sed Rate: 23 mm/hr — ABNORMAL HIGH (ref 0–20)

## 2021-02-12 LAB — URIC ACID: Uric Acid, Serum: 3.8 mg/dL (ref 2.4–7.0)

## 2021-02-12 NOTE — Progress Notes (Signed)
I, Peterson Lombard, LAT, ATC acting as a scribe for Lynne Leader, MD.  Victoria Mcintosh is a 40 y.o. female who presents to [place] at [facility] today for f/u R knee pain ongoing since 09/13/20 w/ no known MOI or trauma. Pt reports she had been doing more around the house and standing more, but can't recall a specific mechanism. Pt was last seen by Dr. Georgina Snell on 11/13/20 and was given a R knee steroid injection and was advised to try Voltaren gel and a compression sleeve and was referred to [facility] PT. Today, pt reports R knee started to bother her again about a week ago. Pt notes on the ride back from vacation, she experienced severe swelling and pain. Pt notes swelling and the pain is progressively worsening. Pt reports knee is feeling stiff and increased pain w/ walking.  Dx imaging: 11/13/20 R knee XR  Pertinent review of systems: No fevers or chills  Relevant historical information: ASCUS cervix   Exam:  BP 124/80   Pulse 86   Ht 4' 10" (1.473 m)   Wt 112 lb 12.8 oz (51.2 kg)   SpO2 100%   BMI 23.58 kg/m  General: Well Developed, well nourished, and in no acute distress.   MSK: Right knee large effusion otherwise normal-appearing range of motion 5-100 degrees.  Tender palpation medial joint line.  Stable ligamentous exam. Intact strength.    Lab and Radiology Results  Procedure: Real-time Ultrasound Guided aspiration and injection right knee effusion superior lateral patellar space Device: Philips Affiniti 50G Images permanently stored and available for review in PACS Ultrasound evaluation prior to injection reveals large joint effusion with intact quad tendon patellar tendon and menisci. Verbal informed consent obtained.  Discussed risks and benefits of procedure. Warned about infection bleeding damage to structures skin hypopigmentation and fat atrophy among others. Patient expresses understanding and agreement Time-out conducted.   Noted no overlying  erythema, induration, or other signs of local infection.   Skin prepped in a sterile fashion.   Local anesthesia: Topical Ethyl chloride.   With sterile technique and under real time ultrasound guidance: 2 mL of lidocaine injected into cutaneous structure and into joint effusion along planned aspiration tract. Skin was again sterilized with isopropyl alcohol. 18-gauge needle was used to access the joint effusion and 40 mL of cloudy blood tinged fluid was aspirated. Syringe was exchanged and 40 mg of Kenalog and 2 mL of lidocaine were injected into the joint space. Fluid seen entering the joint space.   Completed without difficulty   Pain immediately resolved suggesting accurate placement of the medication.   Advised to call if fevers/chills, erythema, induration, drainage, or persistent bleeding.   Images permanently stored and available for review in the ultrasound unit.  Impression: Technically successful ultrasound guided injection.   EXAM: RIGHT KNEE 3 VIEWS   COMPARISON:  None.   FINDINGS: No fracture or malalignment. Joint spaces are patent. Positive for joint effusion   IMPRESSION: Joint effusion.  No acute osseous abnormality     Electronically Signed   By: Donavan Foil M.D.   On: 11/13/2020 18:40 I, Lynne Leader, personally (independently) visualized and performed the interpretation of the images attached in this note.    Assessment and Plan: 40 y.o. female with right knee effusion.  Etiology is unclear.  This is the second time she developed a spontaneous large knee effusion without clear obvious cause.  She does not have severe mechanical symptoms nor does she  have a good story for injury.  X-ray in June was unremarkable.  Plan for metabolic and serologic work-up listed below including joint fluid evaluation cell count differential and culture. If these labs are normal next step would be MRI. Patient expresses understanding and agreement.   PDMP not reviewed this  encounter. Orders Placed This Encounter  Procedures   Anaerobic and Aerobic Culture    Source right knee fluid    Standing Status:   Future    Number of Occurrences:   1    Standing Expiration Date:   02/12/2022   [place] LIMITED JOINT SPACE STRUCTURES LOW RIGHT(NO LINKED CHARGES)    Standing Status:   Future    Number of Occurrences:   1    Standing Expiration Date:   08/12/2021    Order Specific Question:   Reason for Exam (SYMPTOM  OR DIAGNOSIS REQUIRED)    Answer:   right knee pain    Order Specific Question:   Preferred imaging location?    Answer:   [facility]   Synovial cell count + diff, w/ crystals    Right knee fluid    Standing Status:   Future    Number of Occurrences:   1    Standing Expiration Date:   06/11/7354   Cyclic citrul peptide antibody, IgG    Standing Status:   Future    Number of Occurrences:   1    Standing Expiration Date:   02/12/2022   ANA    Standing Status:   Future    Number of Occurrences:   1    Standing Expiration Date:   02/12/2022   Rheumatoid factor    Standing Status:   Future    Number of Occurrences:   1    Standing Expiration Date:   02/12/2022   Sedimentation rate    Standing Status:   Future    Number of Occurrences:   1    Standing Expiration Date:   02/12/2022   Uric acid    Standing Status:   Future    Number of Occurrences:   1    Standing Expiration Date:   02/12/2022   HLA-B27 antigen    Standing Status:   Future    Number of Occurrences:   1    Standing Expiration Date:   02/12/2022   No orders of the defined types were placed in this encounter.    Discussed warning signs or symptoms. Please see discharge instructions. Patient expresses understanding.   The above documentation has been reviewed and is accurate and complete Lynne Leader, M.D.

## 2021-02-12 NOTE — Patient Instructions (Signed)
Thank you for coming in today.   Please get labs today before you leave   If the labs are normal next step is MRI.  I will order the order the MRI when labs come back if they are pretty much normal.

## 2021-02-13 LAB — SYNOVIAL FLUID ANALYSIS, COMPLETE
Basophils, %: 0 %
Eosinophils-Synovial: 0 % (ref 0–2)
Lymphocytes-Synovial Fld: 21 % (ref 0–74)
Monocyte/Macrophage: 30 % (ref 0–69)
Neutrophil, Synovial: 49 % — ABNORMAL HIGH (ref 0–24)
Synoviocytes, %: 0 % (ref 0–15)
WBC, Synovial: 7268 cells/uL — ABNORMAL HIGH (ref <150)

## 2021-02-14 ENCOUNTER — Encounter: Payer: Self-pay | Admitting: Family Medicine

## 2021-02-14 LAB — RHEUMATOID FACTOR: Rheumatoid fact SerPl-aCnc: <14 IU/mL (ref <14)

## 2021-02-14 LAB — ANA: Anti Nuclear Antibody (ANA): NEGATIVE

## 2021-02-14 LAB — ANAEROBIC AND AEROBIC CULTURE

## 2021-02-14 LAB — HLA-B27 ANTIGEN: HLA-B27 Antigen: POSITIVE — AB

## 2021-02-14 LAB — CYCLIC CITRUL PEPTIDE ANTIBODY, IGG: Cyclic Citrullin Peptide Ab: 18 UNITS

## 2021-02-14 NOTE — Progress Notes (Signed)
Labs so far look okay.  The fluid from the knee does not have any gout crystals in it.  The cell count is not dramatically elevated.  Uric acid level is normal indicating gout is very unlikely.  Sedimentation rate is very minimally elevated which is probably normal for you.  Other labs are still pending.

## 2021-02-14 NOTE — Telephone Encounter (Signed)
Called pt to give her the lab result note, but left VM for her to return our call.

## 2021-02-17 NOTE — Addendum Note (Signed)
Addended by: Rodolph Bong on: 02/17/2021 07:14 AM   Modules accepted: Orders

## 2021-02-17 NOTE — Progress Notes (Signed)
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  Other labs are negative so far.   The lab may have canceled the culture but I am not sure about that yet.  I will inquire.  Based on the HLA-B27 being positive I think it makes sense for you to follow-up with rheumatologist.  I will refer you to a rheumatologist.

## 2021-02-18 LAB — CULTURE, BLOOD (SINGLE)
MICRO NUMBER:: 12391481
Result:: NO GROWTH
SPECIMEN QUALITY:: ADEQUATE

## 2021-05-06 ENCOUNTER — Ambulatory Visit (INDEPENDENT_AMBULATORY_CARE_PROVIDER_SITE_OTHER): Payer: Managed Care, Other (non HMO) | Admitting: Physician Assistant

## 2021-05-06 ENCOUNTER — Other Ambulatory Visit: Payer: Self-pay

## 2021-05-06 ENCOUNTER — Encounter: Payer: Self-pay | Admitting: Physician Assistant

## 2021-05-06 DIAGNOSIS — L219 Seborrheic dermatitis, unspecified: Secondary | ICD-10-CM

## 2021-05-06 DIAGNOSIS — L308 Other specified dermatitis: Secondary | ICD-10-CM | POA: Diagnosis not present

## 2021-05-06 DIAGNOSIS — L309 Dermatitis, unspecified: Secondary | ICD-10-CM

## 2021-05-06 MED ORDER — KETOCONAZOLE 2 % EX SHAM: MEDICATED_SHAMPOO | CUTANEOUS | 5 refills | Status: DC

## 2021-05-06 MED ORDER — MINOCYCLINE HCL 50 MG PO CAPS: 50.0000 mg | ORAL_CAPSULE | Freq: Every day | ORAL | 2 refills | Status: DC

## 2021-05-06 MED ORDER — CLOBETASOL PROPIONATE 0.05 % EX SOLN: 1.0000 "application " | Freq: Two times a day (BID) | CUTANEOUS | 3 refills | Status: DC

## 2021-05-06 NOTE — Progress Notes (Signed)
   New Patient   Subjective  Victoria Mcintosh is a 40 y.o. female who presents for the following: New Patient (Initial Visit) (Patient here today for possible psoriasis on scalp x 1 year, around left eye and some under the right eye x 2 months.It is also occurring on her lower right eye lid.  Per patient she does have itching on her scalp. Per patient she's not aware of any family history of psoriasis.Per patient she's seen a Rheumatologist and she was informed to see a Dermatologist. Per patient she hasn't tried anything for her scalp but she has tried OTC cream for her eye but no real improvement. No itching of her eyelids. She is seeing her rheumatologist for a positive marker for inflammation per patient. She complains of joint pain in her hands and knees.  The following portions of the chart were reviewed this encounter and updated as appropriate:  Tobacco  Allergies  Meds  Problems  Med Hx  Surg Hx  Fam Hx      Objective  Well appearing patient in no apparent distress; mood and affect are within normal limits.  All skin waist up examined.  Left Lateral [place]hus, Left Lower Eyelid, Left Upper Eyelid, Right Lower Eyelid Xerosis on an erythematous base       Left Frontal Scalp, Mid Frontal Scalp, Mid Occipital Scalp, Right Forehead Scalp scale   Assessment & Plan  Periocular dermatitis Left Upper Eyelid; Left Lower Eyelid; Right Lower Eyelid; Left Lateral Canthus  minocycline (MINOCIN) 50 MG capsule - Left Lateral [place]hus, Left Lower Eyelid, Left Upper Eyelid, Right Lower Eyelid Take 1 capsule (50 mg total) by mouth daily.  ketoconazole (NIZORAL) 2 % shampoo - Left Lateral [place]hus, Left Lower Eyelid, Left Upper Eyelid, Right Lower Eyelid Apply to scalp and let sit 3-5 minutes then rinse.  clobetasol (TEMOVATE) 0.05 % external solution - Left Lateral [place]hus, Left Lower Eyelid, Left Upper Eyelid, Right Lower Eyelid Apply 1 application topically 2 (two) times  daily.  Anaerobic and Aerobic Culture - Left Lateral [place]hus, Left Lower Eyelid, Left Upper Eyelid, Right Lower Eyelid  Seborrheic dermatitis Left Frontal Scalp; Mid Frontal Scalp; Mid Occipital Scalp; Right Forehead  ketoconazole (NIZORAL) 2 % shampoo - Left Frontal Scalp, Mid Frontal Scalp, Mid Occipital Scalp, Right Forehead Apply to scalp and let sit 3-5 minutes then rinse.  clobetasol (TEMOVATE) 0.05 % external solution - Left Frontal Scalp, Mid Frontal Scalp, Mid Occipital Scalp, Right Forehead Apply 1 application topically 2 (two) times daily.   Obtain records from Rheumatologist.   I, Zakry Caso, PA-C, have reviewed all documentation's for this visit.  The documentation on 05/06/21 for the exam, diagnosis, procedures and orders are all accurate and complete.

## 2021-05-07 ENCOUNTER — Encounter: Payer: Self-pay | Admitting: Family Medicine

## 2021-05-07 DIAGNOSIS — L219 Seborrheic dermatitis, unspecified: Secondary | ICD-10-CM

## 2021-05-07 DIAGNOSIS — L309 Dermatitis, unspecified: Secondary | ICD-10-CM

## 2021-05-07 HISTORY — DX: Dermatitis, unspecified: L30.9

## 2021-05-07 HISTORY — DX: Seborrheic dermatitis, unspecified: L21.9

## 2021-05-07 NOTE — Progress Notes (Signed)
Reviewed report/notes and updated pt's chart/history/PL and/or HM accordingly. 

## 2021-05-12 LAB — ANAEROBIC AND AEROBIC CULTURE
MICRO NUMBER:: 12720218
MICRO NUMBER:: 12720219
SPECIMEN QUALITY:: ADEQUATE
SPECIMEN QUALITY:: ADEQUATE

## 2021-05-27 ENCOUNTER — Encounter: Payer: Self-pay | Admitting: Family Medicine

## 2021-07-09 ENCOUNTER — Ambulatory Visit: Payer: Managed Care, Other (non HMO) | Admitting: Dermatology

## 2021-08-14 ENCOUNTER — Other Ambulatory Visit: Payer: Self-pay

## 2021-08-14 ENCOUNTER — Encounter: Payer: Self-pay | Admitting: Physician Assistant

## 2021-08-14 ENCOUNTER — Ambulatory Visit (INDEPENDENT_AMBULATORY_CARE_PROVIDER_SITE_OTHER): Payer: Managed Care, Other (non HMO) | Admitting: Physician Assistant

## 2021-08-14 DIAGNOSIS — Z1283 Encounter for screening for malignant neoplasm of skin: Secondary | ICD-10-CM | POA: Diagnosis not present

## 2021-08-14 NOTE — Progress Notes (Signed)
? ?  Follow-Up Visit ?  ?Subjective  ?Victoria Mcintosh is a 41 y.o. female who presents for the following: Annual Exam (Patient here today for skin check, no concerns. No personal history or family history of atypical moles, melanoma or non mole skin cancer. ). ? ? ?The following portions of the chart were reviewed this encounter and updated as appropriate:  Tobacco  Allergies  Meds  Problems  Med Hx  Surg Hx  Fam Hx   ?  ? ?Objective  ?Well appearing patient in no apparent distress; mood and affect are within normal limits. ? ?A full examination was performed including scalp, head, eyes, ears, nose, lips, neck, chest, axillae, abdomen, back, buttocks, bilateral upper extremities, bilateral lower extremities, hands, feet, fingers, toes, fingernails, and toenails. All findings within normal limits unless otherwise noted below. ? ?head to toe ?No atypical nevi.No signs of non-mole skin cancer.  ? ? ?Assessment & Plan  ?Encounter for screening for malignant neoplasm of skin ?head to toe ? ?Yearly skin exams ? ? ? ?I, Karenann Mcgrory, PA-C, have reviewed all documentation's for this visit.  The documentation on 08/14/21 for the exam, diagnosis, procedures and orders are all accurate and complete. ?

## 2021-09-25 ENCOUNTER — Encounter: Payer: Self-pay | Admitting: Family Medicine

## 2021-09-25 ENCOUNTER — Ambulatory Visit (INDEPENDENT_AMBULATORY_CARE_PROVIDER_SITE_OTHER): Payer: Managed Care, Other (non HMO) | Admitting: Family Medicine

## 2021-09-25 VITALS — BP 100/76 | HR 75 | Temp 98.1°F | Ht <= 58 in | Wt 111.2 lb

## 2021-09-25 DIAGNOSIS — Z1231 Encounter for screening mammogram for malignant neoplasm of breast: Secondary | ICD-10-CM

## 2021-09-25 DIAGNOSIS — M1280 Other specific arthropathies, not elsewhere classified, unspecified site: Secondary | ICD-10-CM

## 2021-09-25 DIAGNOSIS — M1990 Unspecified osteoarthritis, unspecified site: Secondary | ICD-10-CM

## 2021-09-25 DIAGNOSIS — N6012 Diffuse cystic mastopathy of left breast: Secondary | ICD-10-CM

## 2021-09-25 DIAGNOSIS — R87610 Atypical squamous cells of undetermined significance on cytologic smear of cervix (ASC-US): Secondary | ICD-10-CM | POA: Diagnosis not present

## 2021-09-25 DIAGNOSIS — Z0000 Encounter for general adult medical examination without abnormal findings: Secondary | ICD-10-CM

## 2021-09-25 DIAGNOSIS — N6011 Diffuse cystic mastopathy of right breast: Secondary | ICD-10-CM

## 2021-09-25 LAB — COMPREHENSIVE METABOLIC PANEL
ALT: 12 U/L (ref 0–35)
AST: 16 U/L (ref 0–37)
Albumin: 4.2 g/dL (ref 3.5–5.2)
Alkaline Phosphatase: 29 U/L — ABNORMAL LOW (ref 39–117)
BUN: 12 mg/dL (ref 6–23)
CO2: 28 mEq/L (ref 19–32)
Calcium: 9.1 mg/dL (ref 8.4–10.5)
Chloride: 102 mEq/L (ref 96–112)
Creatinine, Ser: 0.79 mg/dL (ref 0.40–1.20)
GFR: 92.92 mL/min (ref >60.00)
Glucose, Bld: 81 mg/dL (ref 70–99)
Potassium: 3.6 mEq/L (ref 3.5–5.1)
Sodium: 137 mEq/L (ref 135–145)
Total Bilirubin: 0.4 mg/dL (ref 0.2–1.2)
Total Protein: 6.8 g/dL (ref 6.0–8.3)

## 2021-09-25 LAB — CBC WITH DIFFERENTIAL/PLATELET
Basophils Absolute: 0.1 10*3/uL (ref 0.0–0.1)
Basophils Relative: 0.9 % (ref 0.0–3.0)
Eosinophils Absolute: 0.2 10*3/uL (ref 0.0–0.7)
Eosinophils Relative: 3.2 % (ref 0.0–5.0)
HCT: 39 % (ref 36.0–46.0)
Hemoglobin: 13.5 g/dL (ref 12.0–15.0)
Lymphocytes Relative: 31 % (ref 12.0–46.0)
Lymphs Abs: 1.7 10*3/uL (ref 0.7–4.0)
MCHC: 34.6 g/dL (ref 30.0–36.0)
MCV: 90.6 fl (ref 78.0–100.0)
Monocytes Absolute: 0.5 10*3/uL (ref 0.1–1.0)
Monocytes Relative: 8.7 % (ref 3.0–12.0)
Neutro Abs: 3.2 10*3/uL (ref 1.4–7.7)
Neutrophils Relative %: 56.2 % (ref 43.0–77.0)
Platelets: 336 10*3/uL (ref 150.0–400.0)
RBC: 4.3 Mil/uL (ref 3.87–5.11)
RDW: 12.1 % (ref 11.5–15.5)
WBC: 5.6 10*3/uL (ref 4.0–10.5)

## 2021-09-25 LAB — LIPID PANEL
Cholesterol: 152 mg/dL (ref 0–200)
HDL: 52 mg/dL (ref >39.00)
LDL Cholesterol: 88 mg/dL (ref 0–99)
NonHDL: 100.13
Total CHOL/HDL Ratio: 3
Triglycerides: 62 mg/dL (ref 0.0–149.0)
VLDL: 12.4 mg/dL (ref 0.0–40.0)

## 2021-09-25 NOTE — Progress Notes (Signed)
?Subjective  ?Chief Complaint  ?Patient presents with  ? Annual Exam  ?  Pt here for Annual exam and she is currently fasting  ? ? ?HPI: Victoria Mcintosh is a 41 y.o. female who presents to [facility] today for a Female Wellness Visit.  ?She also has the concerns and/or needs as listed above in the chief complaint. These will be addressed in addition to the Health Maintenance Visit.  ? ?Wellness Visit: annual visit with health maintenance review and exam without Pap ? ?HM: pap screen is current: ascus w/o hr hpv: due again in 2025. Discussed mammogram. No concerns ?Chronic disease management visit and/or acute problem visit: ?Inflammatory arthritis: seeing Dr. Dossie Der. Reviewed notes. Prn nsaid. No current pain or change in symptoms.  ? ?Assessment  ?1. Annual physical exam   ?2. ASCUS of cervix with negative high risk HPV   ?3. Inflammatory arthritis   ?4. HLA-B27 positive arthropathy   ?5. Encounter for screening mammogram for breast cancer   ?6. Fibrocystic breast changes of both breasts   ? ?  ?Plan  ?Female Wellness Visit: ?Age appropriate Health Maintenance and Prevention measures were discussed with patient. Included topics are cancer screening recommendations, ways to keep healthy (see AVS) including dietary and exercise recommendations, regular eye and dental care, use of seat belts, and avoidance of moderate alcohol use and tobacco use. Rec mammogram.  ?BMI: discussed patient's BMI and encouraged positive lifestyle modifications to help get to or maintain a target BMI. ?HM needs and immunizations were addressed and ordered. See below for orders. See HM and immunization section for updates. ?Routine labs and screening tests ordered including cmp, cbc and lipids where appropriate. ?Discussed recommendations regarding Vit D and calcium supplementation (see AVS) ? ?Chronic disease f/u and/or acute problem visit: (deemed necessary to be done in addition to the wellness visit): ?Ascus pap:   repeat in 2025 ?Inflammatory arthritis: stable. Prn nsaids.  ?Fibrocystic breast changes: education given. Mammogram.  ? ?Follow up: 12 mo for cpe  ? ?Orders Placed This Encounter  ?Procedures  ? MM DIGITAL SCREENING BILATERAL  ? CBC with Differential/Platelet  ? Comp Met (CMET)  ? Lipid Profile  ? ?No orders of the defined types were placed in this encounter. ? ?  ? ? ?Body mass index is 23.24 kg/m?. ?Wt Readings from Last 3 Encounters:  ?09/25/21 111 lb 3.2 oz (50.4 kg)  ?02/12/21 112 lb 12.8 oz (51.2 kg)  ?11/25/20 115 lb 4.8 oz (52.3 kg)  ? ? ?Patient Active Problem List  ? Diagnosis Date Noted  ? Inflammatory arthritis 09/25/2021  ? HLA-B27 positive arthropathy 09/25/2021  ? Periocular dermatitis 05/07/2021  ?  [place], derm ? ?  ? Seborrheic dermatitis 05/07/2021  ?  [facility], derm ? ?  ? ASCUS of cervix with negative high risk HPV 10/01/2020  ?  08/2020: per ASCCP guidelines, 5 yr risk of CIN3+ is 0.40%: rec repeat pap with HR HPV in 3 years: 2025 ? ?  ? Fibrocystic breast changes of both breasts 09/23/2020  ? Chronic allergic rhinitis 11/07/2018  ? Chronic cough 10/11/2017  ? ?Health Maintenance  ?Topic Date Due  ? COVID-19 Vaccine (1) 10/11/2021 (Originally 10/28/1981)  ? INFLUENZA VACCINE  12/30/2021  ? PAP SMEAR-Modifier  09/24/2023  ? TETANUS/TDAP  09/24/2030  ? Hepatitis C Screening  Completed  ? HIV Screening  Completed  ? HPV VACCINES  Aged Out  ? ?Immunization History  ?Administered Date(s) Administered  ? Tdap 09/23/2020  ? ?  We updated and reviewed the patient's past history in detail and it is documented below. ?Allergies: ?Patient  reports no history of alcohol use. ?Past Medical History ?Patient  has a past medical history of GERD (gastroesophageal reflux disease), Periocular dermatitis (05/07/2021), and Seborrheic dermatitis (05/07/2021). ?Past Surgical History ?Patient  has a past surgical history that includes Cesarean section. ?Social History  ? ?Socioeconomic History  ? Marital status:  Married  ?  Spouse name: Not on file  ? Number of children: 2  ? Years of education: Not on file  ? Highest education level: Not on file  ?Occupational History  ? 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 Smoking status: Former  ?  Packs/day: 0.25  ?  Years: 2.00  ?  Pack years: 0.50  ?  Types: Cigarettes  ?  Quit date: 03/1999  ?  Years since quitting: 22.5  ? Smokeless tobacco: Never  ?Vaping Use  ? Vaping Use: Never used  ?Substance and Sexual Activity  ? Alcohol use: Never  ? Drug use: Never  ? Sexual activity: Yes  ?  Birth control/protection: None  ?Other Topics Concern  ? Not on file  ?Social History Narrative  ? Not on file  ? ?Social Determinants of Health  ? ?Financial Resource Strain: Not on file  ?Food Insecurity: Not on file  ?Transportation Needs: Not on file  ?Physical Activity: Not on file  ?Stress: Not on file  ?Social Connections: Not on file  ? ?Family History  ?Problem Relation Age of Onset  ? Hyperlipidemia Mother   ? Hypertension Mother   ? Miscarriages / [place] Mother   ? Heart attack Father   ? Heart disease Father   ? Hyperlipidemia Father   ? Hypertension Father   ? Healthy Daughter   ? Learning disabilities Son   ? Arthritis Maternal Grandmother   ? Diabetes Maternal Grandmother   ? Cancer Paternal Grandfather   ? ? ?Review of Systems: ?Constitutional: negative for fever or malaise ?Ophthalmic: negative for photophobia, double vision or loss of vision ?Cardiovascular: negative for chest pain, dyspnea on exertion, or new LE swelling ?Respiratory: negative for SOB or persistent cough ?Gastrointestinal: negative for abdominal pain, change in bowel habits or melena ?Genitourinary: negative for dysuria or gross hematuria, no abnormal uterine bleeding or disharge ?Musculoskeletal: negative for new gait disturbance or muscular weakness ?Integumentary: negative for new or persistent rashes, no breast lumps ?Neurological: negative for TIA or stroke symptoms ?Psychiatric:  negative for SI or delusions ?Allergic/Immunologic: negative for hives ? ?Patient Care Team  ?  Relationship Specialty Notifications Start End  ?Leamon Arnt, MD PCP - General Family Medicine  10/11/17   ?Marylynn Pearson, MD Consulting Physician Obstetrics and Gynecology  10/11/17   ?Konrad Felix, MD Referring Physician Ophthalmology  10/11/17   ?Starlyn Skeans Physician Assistant Dermatology  05/07/21   ?Valinda Party, MD Consulting Physician Rheumatology  09/25/21   ? ? ?Objective  ?Vitals: BP 100/76   Pulse 75   Temp 98.1 ?F (36.7 ?C)   Ht 4' 10"  (1.473 m)   Wt 111 lb 3.2 oz (50.4 kg)   SpO2 99%   BMI 23.24 kg/m?  ?General:  Well developed, well nourished, no acute distress  ?Psych:  Alert and orientedx3,normal mood and affect ?HEENT:  Normocephalic, atraumatic, non-icteric sclera, PERRL, supple neck without adenopathy, mass or thyromegaly ?Cardiovascular:  Normal S1, S2, RRR without gallop, rub or murmur ?Respiratory:  Good breath sounds  bilaterally, CTAB with normal respiratory effort ?Gastrointestinal: normal bowel sounds, soft, non-tender, no noted masses. No HSM ?MSK: no deformities, contusions. Joints are without erythema or swelling.  ?Skin:  Warm, no rashes or suspicious lesions noted ?Neurologic:    Mental status is normal. Gross motor and sensory exams are normal. Normal gait. No tremor ?Breast Exam: No mass, skin retraction or nipple discharge is appreciated in either breast. No axillary adenopathy. Fibrocystic changes are noted ? ? ? ?Commons side effects, risks, benefits, and alternatives for medications and treatment plan prescribed today were discussed, and the patient expressed understanding of the given instructions. Patient is instructed to call or message via MyChart if he/she has any questions or concerns regarding our treatment plan. No barriers to understanding were identified. We discussed Red Flag symptoms and signs in detail. Patient expressed understanding regarding  what to do in case of urgent or emergency type symptoms.  ?Medication list was reconciled, printed and provided to the patient in AVS. Patient instructions and summary information was reviewed with the patient as doc

## 2021-09-25 NOTE — Patient Instructions (Addendum)
Please return in 12 months for your annual complete physical; please come fasting.  ? ?I will release your lab results to you on your MyChart account with further instructions. You may see the results before I do, but when I review them I will send you a message with my report or have my assistant call you if things need to be discussed. Please reply to my message with any questions. Thank you!  ? ?If you have any questions or concerns, please don't hesitate to send me a message via MyChart or call the office at [phone]. Thank you for visiting with Victoria Mcintosh today! It's our pleasure caring for you.  ?I have ordered a mammogram and/or bone density for you as we discussed today: ?[x]   Mammogram  ?[]   Bone Density ? ?Please call the office checked below to schedule your appointment: ? ?[x]   [facility]      ? [address]      ? [place],       ? [phone]        ? ?[]   [facility] ? [address] [place]  ? [place], [facility] ? [phone] ? ?Please do these things to maintain good health! ? ?Exercise at least 30-45 minutes a day,  4-5 days a week.  ?Eat a low-fat diet with lots of fruits and vegetables, up to 7-9 servings per day. ?Drink plenty of water daily. Try to drink 8 8oz glasses per day. ?Seatbelts can save your life. Always wear your seatbelt. ?Place Smoke Detectors on every level of your home and check batteries every year. ?Schedule an appointment with an eye doctor for an eye exam every 1-2 years ?Safe sex - use condoms to protect yourself from STDs if you could be exposed to these types of infections. Use birth control if you do not want to become pregnant and are sexually active. ?Avoid heavy alcohol use. If you drink, keep it to less than 2 drinks/day and not every day. ?Health Care Power of Attorney.  Choose someone you trust that could speak for you if you became unable to speak for yourself. ?Depression is common in our stressful world.If you're feeling down  or losing interest in things you normally enjoy, please come in for a visit. ?If anyone is threatening or hurting you, please get help. Physical or Emotional Violence is never OK.   ?Fibrocystic Breast Changes ? ?Fibrocystic breast changes are changes in breast tissue that can cause breasts to become swollen, lumpy, or painful. This can happen due to buildup of scar-like tissue (fibrous tissue) or the forming of fluid-filled lumps (cysts) in the breast. Fibrocystic breast changes can affect one or both breasts. The condition is common, and it is not cancer. ?What are the causes? ?The exact cause of fibrocystic breast changes is not known. However, this condition may be: ?Related to the female hormones estrogen and progesterone. ?Influenced by family traits that get passed from parent to child (inherited). ?What are the signs or symptoms? ?Symptoms of this condition include: ?Tenderness, swelling, mild discomfort, or pain. ?Rope-like tissue that can be felt when touching the breast. ?Lumps in one or both breasts. ?Changes in breast size. Breasts may get larger before a menstrual period and smaller after a menstrual period. ?Discharge from the nipple. ?Symptoms of this condition may affect one or both breasts and are usually worse before menstrual periods start. Symptoms usually get better toward the end of menstrual periods. ?How is this diagnosed? ?This  condition is diagnosed based on your medical history and a physical exam of your breasts. You may also have tests, such as: ?A breast X-ray (mammogram). ?Ultrasound. ?MRI. ?Removing a small sample of tissue from the breast for tests (breast biopsy). This may be done if your health care provider thinks that something else may be causing changes in your breasts. ?How is this treated? ?Often, treatment is not needed for this condition. In some cases, however, treatment may be needed, including: ?Taking over-the-counter pain medicines to help relieve pain or  discomfort. ?Limiting or avoiding caffeine. Foods and beverages that contain caffeine include chocolate, soda, coffee, and tea. ?Reducing sugar and fat in your diet. ?Treatment may also include: ?A procedure to remove fluid from a cyst that is causing pain (fine needle aspiration). ?Surgery to remove a cyst that is large or tender or does not go away. ?Medicines that may lower the amount of female hormones. ?Follow these instructions at home: ?Self care ?Check your breasts after every menstrual period. If you do not have menstrual periods, check your breasts on the first day of every month. Feel for changes in your breasts, such as: ?More tenderness. ?A new growth. ?A change in size. ?A change in an existing lump. ?General instructions ?Take over-the-counter and prescription medicines only as told by your health care provider. ?Wear a well-fitting support or sports bra, especially when exercising. ?If told by your health care provider, decrease or avoid caffeine, fat, and sugar in your diet. ?Keep all follow-up visits as told by your health care provider. This is important. ?Contact a health care provider if: ?You have fluid leaking from your nipple, especially if it is bloody. ?You have new lumps or bumps in your breast. ?Your breast becomes enlarged, red, and painful. ?You have areas of your breast that pucker inward. ?Your nipple appears flat or indented. ?Get help right away if: ?You have redness of your breast and the redness is spreading. ?Summary ?Fibrocystic breast changes are changes in breast tissue that can cause breasts to become swollen, lumpy, or painful. ?This condition may be related to the female hormones estrogen and progesterone. ?With this condition, it is important to examine your breasts after every menstrual period. If you do not have menstrual periods, check your breasts on the first day of every month. ?This information is not intended to replace advice given to you by your health care  provider. Make sure you discuss any questions you have with your health care provider. ?Document Revised: 05/01/2019 Document Reviewed: 05/01/2019 ?Elsevier Patient Education ? 2023 Elsevier Inc. ? ?

## 2021-12-16 ENCOUNTER — Ambulatory Visit (INDEPENDENT_AMBULATORY_CARE_PROVIDER_SITE_OTHER): Payer: Managed Care, Other (non HMO) | Admitting: Physician Assistant

## 2021-12-16 ENCOUNTER — Encounter: Payer: Self-pay | Admitting: Physician Assistant

## 2021-12-16 VITALS — BP 110/70 | HR 83 | Temp 98.3°F | Ht <= 58 in | Wt 110.0 lb

## 2021-12-16 DIAGNOSIS — R052 Subacute cough: Secondary | ICD-10-CM | POA: Diagnosis not present

## 2021-12-16 NOTE — Progress Notes (Signed)
Victoria Mcintosh is a 41 y.o. female here for a follow up of a pre-existing problem.  History of Present Illness:   Chief Complaint  Patient presents with   Cough    Pt c/o persistent cough for months, worse past month. Coughing expectorating yellow sputum occasionally.Denies fever or chills. Chest congestion. Pt says has been like this since she had COVID 06/2020.    HPI  Cough Patient complain of cough that has been onset for a while. Has been coughing up yellow sputum and chest congestion for the past 6 months.  States her symptoms have been the same since having Covid in Jan 2022. Has had "weird" feeling in her chest. Comes and goes. Symptoms seems to be worsening for 1 month. Does have hx of smoking for 2 years but not recently. Has had noticed some blood with cough which she described tiny blood coming out when brushing teeth. This only happened once. Does feel short of breath sometimes. She does admit sanitizing her house with alcohol due to concern for Covid -- states that she has been spraying it in her home. No specific treatment tried. At this time. She would like to be further evaluated for her symptoms.   No recent travel. No night sweats or unintentional weight loss. Denies fever or chills. No hx of asthma, COPD or lung disease. No fullness or pressure in ear. No nasal congestion. No swollen glands.   Past Medical History:  Diagnosis Date   GERD (gastroesophageal reflux disease)    Periocular dermatitis 05/07/2021   [facility], derm   Seborrheic dermatitis 05/07/2021   [facility], derm     Social History   Tobacco Use   Smoking status: Former    Packs/day: 0.25    Years: 2.00    Total pack years: 0.50    Types: Cigarettes    Quit date: 03/1999    Years since quitting: 22.8   Smokeless tobacco: Never  Vaping Use   Vaping Use: Never used  Substance Use Topics   Alcohol use: Never   Drug use: Never    Past Surgical History:  Procedure Laterality Date   CESAREAN SECTION       Family History  Problem Relation Age of Onset   Hyperlipidemia Mother    Hypertension Mother    Miscarriages / Stillbirths Mother    Heart attack Father    Heart disease Father    Hyperlipidemia Father    Hypertension Father    Healthy Daughter    Learning disabilities Son    Arthritis Maternal Grandmother    Diabetes Maternal Grandmother    Cancer Paternal Grandfather     No Known Allergies  Current Medications:   Current Outpatient Medications:    Ascorbic Acid (VITAMIN C) 250 MG CHEW, Chew by mouth., Disp: , Rfl:    BLACK ELDERBERRY,BERRY-FLOWER, PO, Take 1 capsule by mouth daily. Elder berry 500 mg , Vit C 400 mg  And 125 mcg Vit D3, Disp: , Rfl:    Coenzyme Q10 (CO Q10) 100 MG CAPS, Take 1 capsule by mouth daily., Disp: , Rfl:    MAGNESIUM GLYCINATE PO, Take 2 capsules by mouth daily in the afternoon., Disp: , Rfl:    Multiple Vitamins-Minerals (ALIVE WOMENS ENERGY) TABS, Take 1 tablet by mouth daily., Disp: , Rfl:    Omega-3 Fatty Acids (FISH OIL PO), Take 1 capsule by mouth daily in the afternoon., Disp: , Rfl:    TURMERIC PO, Take 1,000 mg by mouth daily., Disp: ,  Rfl:    Review of Systems:   ROS Negative unless otherwise specified per HPI.   Vitals:   Vitals:   12/16/21 1350  BP: 110/70  Pulse: 83  Temp: 98.3 F (36.8 C)  TempSrc: Temporal  SpO2: 98%  Weight: 110 lb (49.9 kg)  Height: 4\' 10"  (1.473 m)     Body mass index is 22.99 kg/m.  Physical Exam:   Physical Exam Vitals and nursing note reviewed.  Constitutional:      General: She is not in acute distress.    Appearance: She is well-developed. She is not ill-appearing or toxic-appearing.  HENT:     Head: Normocephalic and atraumatic.     Right Ear: Tympanic membrane, ear canal and external ear normal. Tympanic membrane is not erythematous, retracted or bulging.     Left Ear: Tympanic membrane, ear canal and external ear normal. Tympanic membrane is not erythematous, retracted or  bulging.     Nose: Nose normal.     Right Sinus: No maxillary sinus tenderness or frontal sinus tenderness.     Left Sinus: No maxillary sinus tenderness or frontal sinus tenderness.     Mouth/Throat:     Pharynx: Uvula midline. No posterior oropharyngeal erythema.  Eyes:     General: Lids are normal.     Conjunctiva/sclera: Conjunctivae normal.  Neck:     Trachea: Trachea normal.  Cardiovascular:     Rate and Rhythm: Normal rate and regular rhythm.     Pulses: Normal pulses.     Heart sounds: Normal heart sounds, S1 normal and S2 normal.  Pulmonary:     Effort: Pulmonary effort is normal.     Breath sounds: Normal breath sounds. No decreased breath sounds, wheezing, rhonchi or rales.  Lymphadenopathy:     Cervical: No cervical adenopathy.  Skin:    General: Skin is warm and dry.  Neurological:     Mental Status: She is alert.     GCS: GCS eye subscore is 4. GCS verbal subscore is 5. GCS motor subscore is 6.  Psychiatric:        Speech: Speech normal.        Behavior: Behavior normal. Behavior is cooperative.     Assessment and Plan:   Subacute cough She is in NAD I discussed that chronic cough can be caused by a multitude of issues -- including, but not limited to, GERD, PND, malignancy, infection, among several other issues I recommended that she trial daily antihistamine however she reports that she is uncomfortable putting chemicals in her body I recommended CXR to evaluate cough and 1 x episode of bleeding but she declined due to possible cost -- she will call her insurance to see how much this costs.   Encouraged patient to stop spraying alcohol in her home Follow-up with PCP if new/worsening symptoms  I,Savera Zaman,acting as a scribe for , PA.,have documented all relevant documentation on the behalf of [employer], PA,as directed by  Jarold Motto, PA while in the presence of Jarold Motto, Jarold Motto.   I, [place], Jarold Motto, have reviewed all  documentation for this visit. The documentation on 12/16/21 for the exam, diagnosis, procedures, and orders are all accurate and complete.  Time spent with patient today was 30 minutes which consisted of chart review, discussing diagnosis, work up, treatment answering questions and documentation.    12/18/21, PA-C

## 2021-12-16 NOTE — Patient Instructions (Signed)
It was great to see you!  Start over the counter antihistamines such as Zyrtec (cetirizine), Claritin (loratadine), Allegra (fexofenadine), or Xyzal (levocetirizine) daily x 2-4 weeks.  If symptoms do not improve, please follow-up with Dr. Mardelle Matte to discuss possible chest xray.  If new symptoms, please follow-up with Dr. Mardelle Matte.  Take care,  Jarold Motto PA-C

## 2021-12-25 ENCOUNTER — Other Ambulatory Visit: Payer: Self-pay

## 2021-12-25 ENCOUNTER — Encounter: Payer: Self-pay | Admitting: Family Medicine

## 2021-12-25 ENCOUNTER — Ambulatory Visit (INDEPENDENT_AMBULATORY_CARE_PROVIDER_SITE_OTHER): Payer: Managed Care, Other (non HMO) | Admitting: Family Medicine

## 2021-12-25 VITALS — BP 110/78 | HR 89 | Temp 98.0°F | Resp 18 | Ht <= 58 in | Wt 111.0 lb

## 2021-12-25 DIAGNOSIS — R0600 Dyspnea, unspecified: Secondary | ICD-10-CM

## 2021-12-25 DIAGNOSIS — R052 Subacute cough: Secondary | ICD-10-CM

## 2021-12-25 DIAGNOSIS — R0982 Postnasal drip: Secondary | ICD-10-CM | POA: Diagnosis not present

## 2021-12-25 DIAGNOSIS — R040 Epistaxis: Secondary | ICD-10-CM | POA: Diagnosis not present

## 2021-12-25 NOTE — Progress Notes (Signed)
Subjective:  Patient ID: Victoria Mcintosh, female    DOB: 02/22/1981  Age: 41 y.o. MRN: [id number]  CC:  Chief Complaint  Patient presents with   Chest Pain    Patient states she has been having a ache in her chest and some SOB when she is moving. She noticed more trying to hold her breath at pool    HPI Victoria Mcintosh presents for   Chest pain, dyspnea Chart reviewed, was most recently seen July 18 by Dr. Traci Sermon for persistent cough with discolored mucus, chest congestion for the previous 6 months.  COVID January 2022.  Intermittent symptoms in chest and dyspnea were noted at that time as well.  No history of COPD, asthma or known chronic lung disease.  Lungs were noted to be clear on exam.  Recommend daily antihistamine as a trial, and chest x-ray given reported previous episode of possible hemoptysis.  Recommended discontinuing alcohol spray in home.   History today - notes in past month. Dull ache/sensation in chest - diffuse. Comes and goes. Feels like can't catch breath at times, feels like needs to take deep breath. No wheeze. Not able to hold breath under water as long.  Concerned about using spray alcohol for years to sanitize for covid and possible impact on lung function.  No hx of dvt/PE,No recent prolonged car travel or air travel, no recent calf pain or swelling.no hx of chronic lung disease.  Seasonal allergies only.  Some heartburn at times. Rare - 1-2 times per year.  Frequent throat clearing. Some PND at times, some runny nose. On recent heartburn.  No palpitations, fever, weight loss, night sweats.  No nosebleed, but some blood in R nasal d/c at times. Few times of blood in phlegm. Some cough daily, dry past week or two.  No otc tx.   Lmp normal 12/06/21.    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   Periocular dermatitis 05/07/2021   Seborrheic dermatitis 05/07/2021    ASCUS of cervix with negative high risk HPV 10/01/2020   Fibrocystic breast changes of both breasts 09/23/2020   Chronic allergic rhinitis 11/07/2018   Chronic cough 10/11/2017   Past Medical History:  Diagnosis Date   GERD (gastroesophageal reflux disease)    Periocular dermatitis 05/07/2021   [place], derm   Seborrheic dermatitis 05/07/2021   [place], derm   Past Surgical History:  Procedure Laterality Date   CESAREAN SECTION     No Known Allergies Prior to Admission medications   Medication Sig Start Date End Date Taking? Authorizing Provider  Ascorbic Acid (VITAMIN C) 250 MG CHEW Chew by mouth.   Yes [provider]  BLACK ELDERBERRY,BERRY-FLOWER, PO Take 1 capsule by mouth daily. Elder berry 500 mg , Vit C 400 mg  And 125 mcg Vit D3   Yes [provider]  Coenzyme Q10 (CO Q10) 100 MG CAPS Take 1 capsule by mouth daily.   Yes [provider]  MAGNESIUM GLYCINATE PO Take 2 capsules by mouth daily in the afternoon.   Yes [provider]  Multiple Vitamins-Minerals (ALIVE WOMENS ENERGY) TABS Take 1 tablet by mouth daily.   Yes [provider]  Omega-3 Fatty Acids (FISH OIL PO) Take 1 capsule by mouth daily in the afternoon.   Yes [provider]  TURMERIC PO Take 1,000 mg by mouth daily.   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[employer]; home schools  Tobacco Use   Smoking status: Former    Packs/day: 0.25    Years: 2.00    Total pack years: 0.50    Types: Cigarettes    Quit date: 03/1999    Years since quitting: 22.8   Smokeless tobacco: Never  Vaping Use   Vaping Use: Never used  Substance and Sexual Activity   Alcohol use: Never   Drug use: Never   Sexual activity: Yes    Birth  control/protection: None  Other Topics Concern   Not on file  Social History Narrative   Not on file   Social Determinants of Health   Financial Resource Strain: Not on file  Food Insecurity: Not on file  Transportation Needs: Not on file  Physical Activity: Not on file  Stress: Not on file  Social Connections: Not on file  Intimate Partner Violence: Not on file    Review of Systems Per HPI  Objective:   Vitals:   12/25/21 1304  BP: 110/78  Pulse: 89  Resp: 18  Temp: 98 F (36.7 C)  TempSrc: Temporal  SpO2: 98%  Weight: 111 lb (50.3 kg)  Height: 4' 10" (1.473 m)     Physical Exam Vitals reviewed.  Constitutional:      Appearance: Normal appearance. She is well-developed.  HENT:     Head: Normocephalic and atraumatic.     Nose:     Comments: Small area of dried blood on right mid septum.  No active bleeding, appears midline.  No foreign bodies or acute nasal discharge. Eyes:     Conjunctiva/sclera: Conjunctivae normal.     Pupils: Pupils are equal, round, and reactive to light.  Neck:     Vascular: No carotid bruit.  Cardiovascular:     Rate and Rhythm: Normal rate and regular rhythm.     Heart sounds: Normal heart sounds.  Pulmonary:     Effort: Pulmonary effort is normal.     Breath sounds: Normal breath sounds.  Abdominal:     Palpations: Abdomen is soft. There is no pulsatile mass.     Tenderness: There is no abdominal tenderness.  Musculoskeletal:     Right lower leg: No edema.     Left lower leg: No edema.  Skin:    General: Skin is warm and dry.  Neurological:     Mental Status: She is alert and oriented to person, place, and time.  Psychiatric:        Mood and Affect: Mood normal.        Behavior: Behavior normal.      32 minutes spent during visit, including chart review, review of last note with cough, history as above, counseling and assimilation of information, exam, discussion of plan, and chart completion.    Assessment & Plan:   Victoria Mcintosh is a 41 y.o. female . Subacute cough - Plan: DG Chest 2 View  Dyspnea, unspecified type  Postnasal drip  Epistaxis  Describes symptoms appear to be more consistent with cough and upper airway cough syndrome, feeling to take a deep breath without true dyspnea.  No risk factors known for DVT/PE and reassuring vitals, exam, no true chest pain.  Unlikely cardiac  cause.  Did report few episodes of possible hemoptysis but may be related to right-sided epistaxis, swallowed mucus followed by cough with blood.  -Check chest x-ray.  Start Flonase for possible allergic cause of upper airway cough syndrome, saline nasal spray for irritated right nasal passage, Pepcid for possible reflux component of upper airway cough syndrome.  If not improving at follow-up in the next 3 to 4 weeks, refer to pulmonary, RTC/ER precautions if worse sooner.  No orders of the defined types were placed in this encounter.  Patient Instructions   As we discussed cough could be from irritation of the upper airway.  That can happen from heartburn or allergies/postnasal drip.  There is a small area of blood on the right inside part of your nose.  That may be the cause of some of the blood seen in the mucus.  Start Flonase over-the-counter as well as Pepcid to treat for allergies and possible heartburn cause.  Saline nasal spray few times per day can help with the irritated area on the right nasal passage.  Please have chest x-ray performed at the [place] location below.  If any concerns I will let you know.  Follow-up next 3 to 4 weeks, sooner if worse.  I am happy to refer you to pulmonary, but would like to see if the treatments above help first. Take care   If you have lab work done today you will be contacted with your lab results within the next 2 weeks.  If you have not heard from us then please contact us. The fastest way to get your results is to register for My Chart.   IF you received an x-ray today,  you will receive an invoice from [facility]. Please contact [facility] at [phone] with questions or concerns regarding your invoice.   IF you received labwork today, you will receive an invoice from [facility]. Please contact [facility] at [phone] with questions or concerns regarding your invoice.   Our billing staff will not be able to assist you with questions regarding bills from these companies.  You will be contacted with the lab results as soon as they are available. The fastest way to get your results is to activate your My Chart account. Instructions are located on the last page of this paperwork. If you have not heard from us regarding the results in 2 weeks, please contact this office.        Signed,   Jeffrey Greene, MD [facility], [facility] 12/26/21 12:47 PM   

## 2021-12-25 NOTE — Patient Instructions (Addendum)
  As we discussed cough could be from irritation of the upper airway.  That can happen from heartburn or allergies/postnasal drip.  There is a small area of blood on the right inside part of your nose.  That may be the cause of some of the blood seen in the mucus.  Start Flonase over-the-counter as well as Pepcid to treat for allergies and possible heartburn cause.  Saline nasal spray few times per day can help with the irritated area on the right nasal passage.  Please have chest x-ray performed at the [facility] location below.  If any concerns I will let you know.  Follow-up next 3 to 4 weeks, sooner if worse.  I am happy to refer you to pulmonary, but would like to see if the treatments above help first. Take care   If you have lab work done today you will be contacted with your lab results within the next 2 weeks.  If you have not heard from [place] then please contact us. The fastest way to get your results is to register for My Chart.   IF you received an x-ray today, you will receive an invoice from [facility]. Please contact [facility] at [phone] with questions or concerns regarding your invoice.   IF you received labwork today, you will receive an invoice from [facility]. Please contact [facility] at [phone] with questions or concerns regarding your invoice.   Our billing staff will not be able to assist you with questions regarding bills from these companies.  You will be contacted with the lab results as soon as they are available. The fastest way to get your results is to activate your My Chart account. Instructions are located on the last page of this paperwork. If you have not heard from [place] regarding the results in 2 weeks, please contact this office.

## 2021-12-26 ENCOUNTER — Encounter: Payer: Self-pay | Admitting: Family Medicine

## 2022-01-02 ENCOUNTER — Ambulatory Visit (INDEPENDENT_AMBULATORY_CARE_PROVIDER_SITE_OTHER)
Admission: RE | Admit: 2022-01-02 | Discharge: 2022-01-02 | Disposition: A | Discharge status: Home / Self Care | Payer: Managed Care, Other (non HMO) | Source: Ambulatory Visit | Attending: Family Medicine | Admitting: Family Medicine

## 2022-01-02 DIAGNOSIS — R052 Subacute cough: Secondary | ICD-10-CM

## 2022-02-04 ENCOUNTER — Ambulatory Visit: Payer: Managed Care, Other (non HMO) | Admitting: Family Medicine

## 2022-02-23 ENCOUNTER — Encounter: Payer: Self-pay | Admitting: *Deleted

## 2022-05-14 ENCOUNTER — Encounter: Payer: Self-pay | Admitting: *Deleted

## 2022-07-13 IMAGING — DX DG KNEE AP/LAT W/ SUNRISE*R*
3 series · 3 of 3 positions shown · non-contrast
Comparison: None.

CLINICAL DATA: Stiffness and swelling

EXAM:
RIGHT KNEE 3 VIEWS

[knee ap]
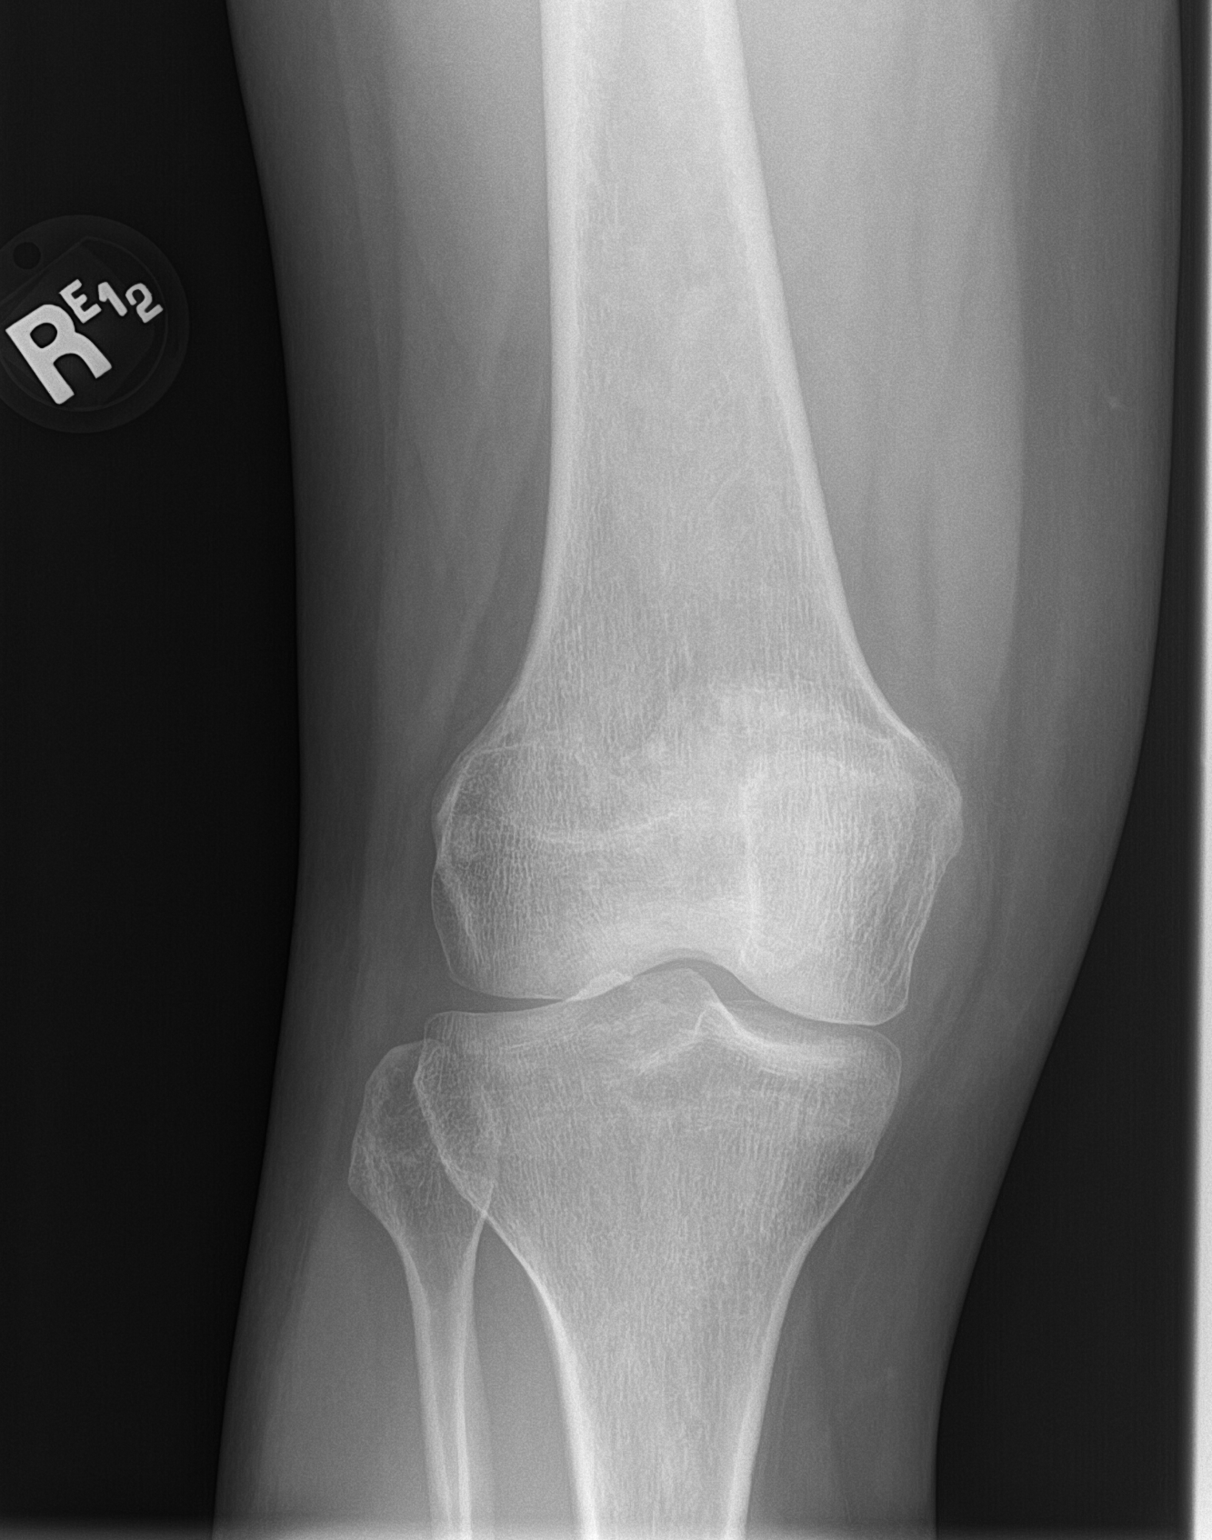

[knee lat]
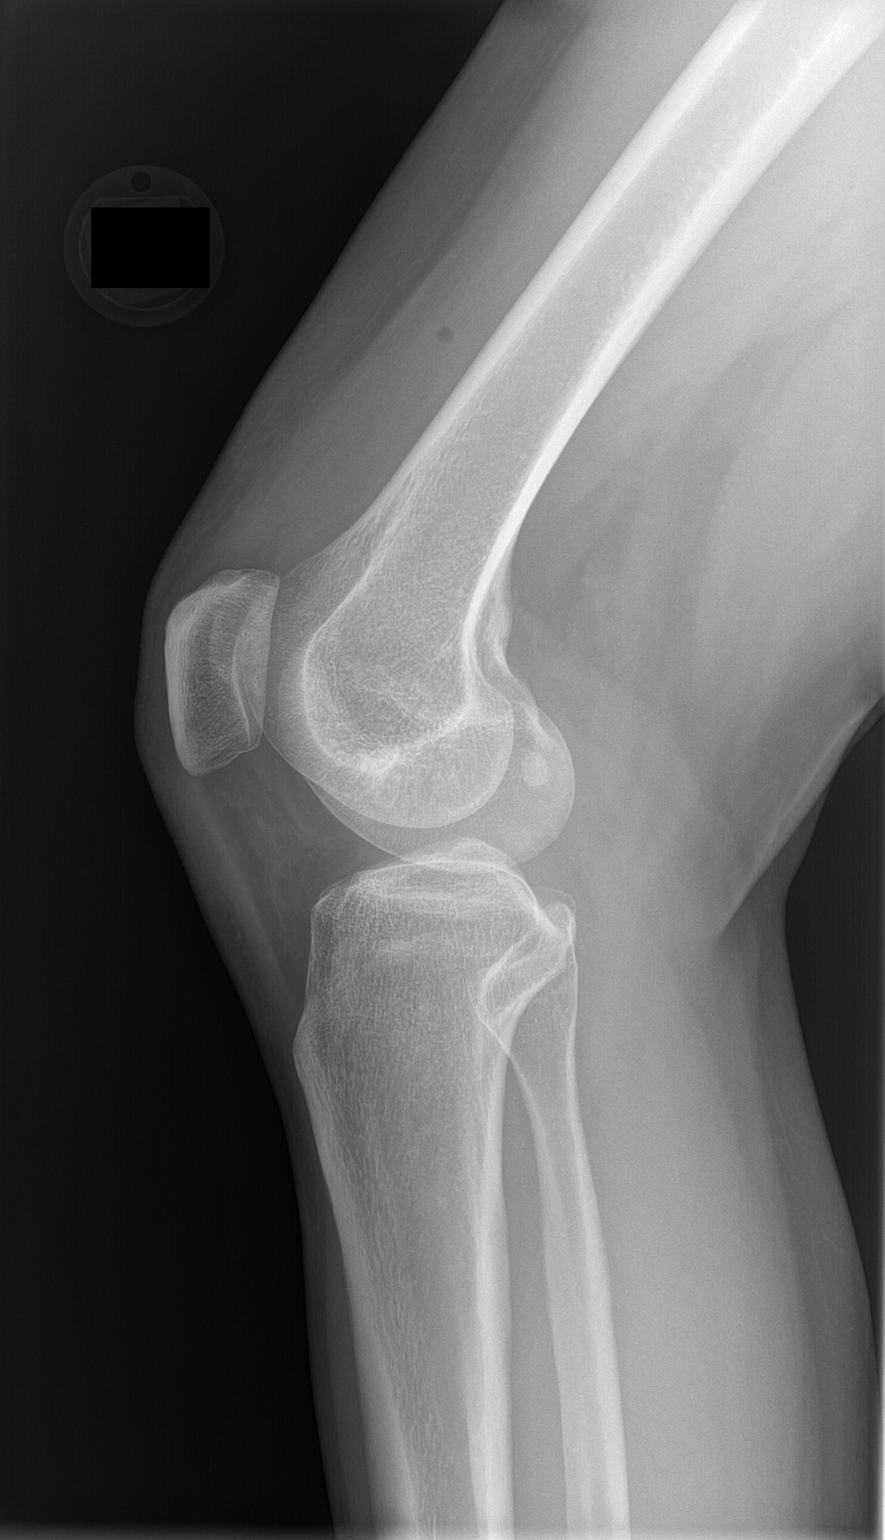

[patella]
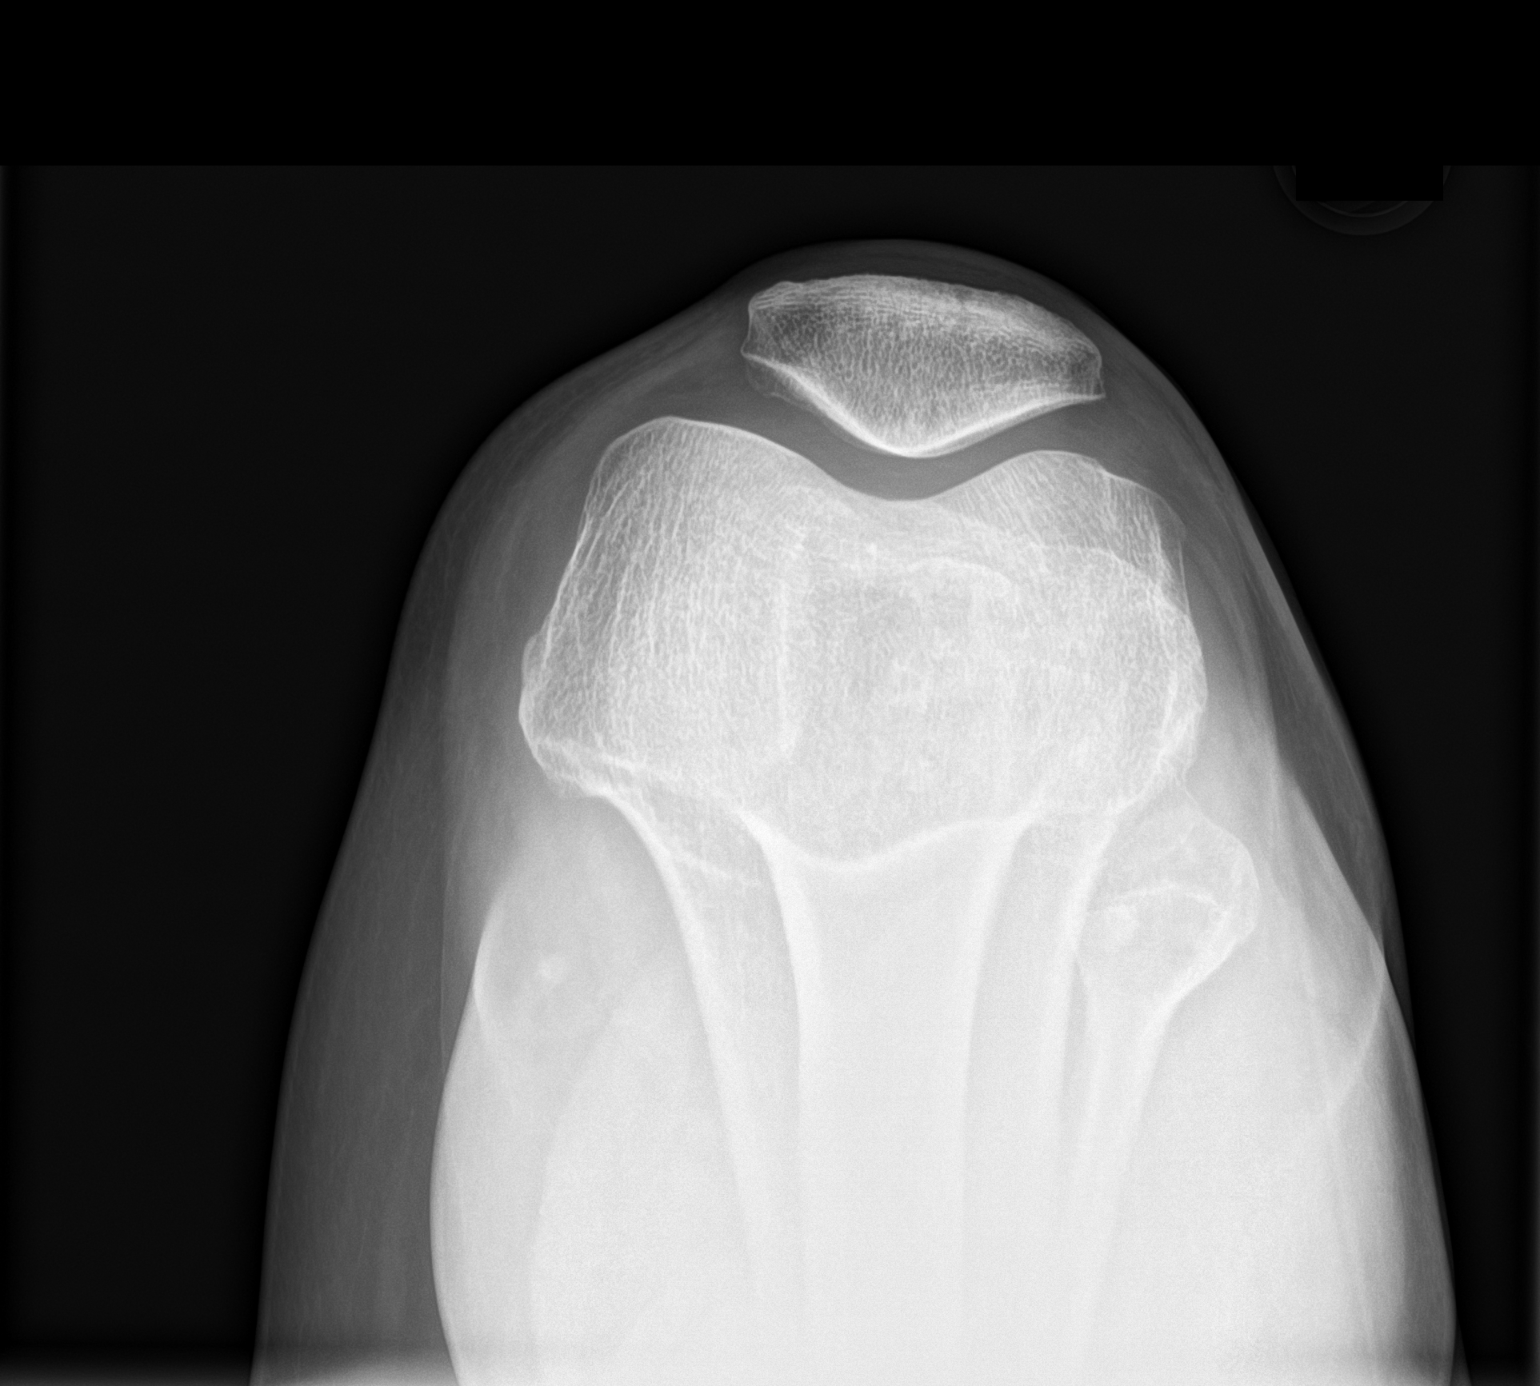

[3 of 3 positions shown; findings below may reference images not displayed]

FINDINGS: No fracture or malalignment. Joint spaces are patent. Positive for
joint effusion
IMPRESSION: Joint effusion.  No acute osseous abnormality

## 2022-08-31 ENCOUNTER — Encounter: Payer: Self-pay | Admitting: Family Medicine

## 2022-08-31 ENCOUNTER — Ambulatory Visit: Payer: Managed Care, Other (non HMO) | Admitting: Family Medicine

## 2022-08-31 ENCOUNTER — Telehealth: Payer: Self-pay | Admitting: Family Medicine

## 2022-08-31 VITALS — BP 100/64 | HR 88 | Temp 98.6°F | Ht <= 58 in | Wt 110.4 lb

## 2022-08-31 DIAGNOSIS — N3001 Acute cystitis with hematuria: Secondary | ICD-10-CM | POA: Diagnosis not present

## 2022-08-31 DIAGNOSIS — R300 Dysuria: Secondary | ICD-10-CM

## 2022-08-31 LAB — POCT URINALYSIS DIPSTICK OB
Bilirubin, UA: NEGATIVE
Blood, UA: POSITIVE
Glucose, UA: NEGATIVE
Ketones, UA: NEGATIVE
Nitrite, UA: NEGATIVE
Spec Grav, UA: 1.01 (ref 1.010–1.025)
Urobilinogen, UA: 0.2 E.U./dL
pH, UA: 6.5 (ref 5.0–8.0)

## 2022-08-31 MED ORDER — NITROFURANTOIN MONOHYD MACRO 100 MG PO CAPS: 100.0000 mg | ORAL_CAPSULE | Freq: Two times a day (BID) | ORAL | 0 refills | Status: DC

## 2022-08-31 MED ORDER — PHENAZOPYRIDINE HCL 100 MG PO TABS: 100.0000 mg | ORAL_TABLET | Freq: Three times a day (TID) | ORAL | 0 refills | Status: DC | PRN

## 2022-08-31 NOTE — Telephone Encounter (Signed)
We did make pt aware of the patient panel would you accept pt as a new pt or est with [place] ?

## 2022-08-31 NOTE — Telephone Encounter (Signed)
Pt states you explained it to her why  you weren't taking new patients not sure why she's calling back??

## 2022-08-31 NOTE — Patient Instructions (Signed)
Start antibiotic for urinary tract infection, Pyridium if needed for burning or discomfort with urination.  Drink plenty of fluids.  I expect symptoms to improve quickly within the next few days.  See information below.  Take care!  Urinary Tract Infection, Adult  A urinary tract infection (UTI) is an infection of any part of the urinary tract. The urinary tract includes the kidneys, ureters, bladder, and urethra. These organs make, store, and get rid of urine in the body. An upper UTI affects the ureters and kidneys. A lower UTI affects the bladder and urethra. What are the causes? Most urinary tract infections are caused by bacteria in your genital area around your urethra, where urine leaves your body. These bacteria grow and cause inflammation of your urinary tract. What increases the risk? You are more likely to develop this condition if: You have a urinary catheter that stays in place. You are not able to control when you urinate or have a bowel movement (incontinence). You are female and you: Use a spermicide or diaphragm for birth control. Have low estrogen levels. Are pregnant. You have certain genes that increase your risk. You are sexually active. You take antibiotic medicines. You have a condition that causes your flow of urine to slow down, such as: An enlarged prostate, if you are female. Blockage in your urethra. A kidney stone. A nerve condition that affects your bladder control (neurogenic bladder). Not getting enough to drink, or not urinating often. You have certain medical conditions, such as: Diabetes. A weak disease-fighting system (immunesystem). Sickle cell disease. Gout. Spinal cord injury. What are the signs or symptoms? Symptoms of this condition include: Needing to urinate right away (urgency). Frequent urination. This may include small amounts of urine each time you urinate. Pain or burning with urination. Blood in the urine. Urine that smells bad or  unusual. Trouble urinating. Cloudy urine. Vaginal discharge, if you are female. Pain in the abdomen or the lower back. You may also have: Vomiting or a decreased appetite. Confusion. Irritability or tiredness. A fever or chills. Diarrhea. The first symptom in older adults may be confusion. In some cases, they may not have any symptoms until the infection has worsened. How is this diagnosed? This condition is diagnosed based on your medical history and a physical exam. You may also have other tests, including: Urine tests. Blood tests. Tests for STIs (sexually transmitted infections). If you have had more than one UTI, a cystoscopy or imaging studies may be done to determine the cause of the infections. How is this treated? Treatment for this condition includes: Antibiotic medicine. Over-the-counter medicines to treat discomfort. Drinking enough water to stay hydrated. If you have frequent infections or have other conditions such as a kidney stone, you may need to see a health care provider who specializes in the urinary tract (urologist). In rare cases, urinary tract infections can cause sepsis. Sepsis is a life-threatening condition that occurs when the body responds to an infection. Sepsis is treated in the hospital with IV antibiotics, fluids, and other medicines. Follow these instructions at home:  Medicines Take over-the-counter and prescription medicines only as told by your health care provider. If you were prescribed an antibiotic medicine, take it as told by your health care provider. Do not stop using the antibiotic even if you start to feel better. General instructions Make sure you: Empty your bladder often and completely. Do not hold urine for long periods of time. Empty your bladder after sex. Wipe from front to  back after urinating or having a bowel movement if you are female. Use each tissue only one time when you wipe. Drink enough fluid to keep your urine pale  yellow. Keep all follow-up visits. This is important. Contact a health care provider if: Your symptoms do not get better after 1-2 days. Your symptoms go away and then return. Get help right away if: You have severe pain in your back or your lower abdomen. You have a fever or chills. You have nausea or vomiting. Summary A urinary tract infection (UTI) is an infection of any part of the urinary tract, which includes the kidneys, ureters, bladder, and urethra. Most urinary tract infections are caused by bacteria in your genital area. Treatment for this condition often includes antibiotic medicines. If you were prescribed an antibiotic medicine, take it as told by your health care provider. Do not stop using the antibiotic even if you start to feel better. Keep all follow-up visits. This is important. This information is not intended to replace advice given to you by your health care provider. Make sure you discuss any questions you have with your health care provider. Document Revised: 12/29/2019 Document Reviewed: 12/29/2019 Elsevier Patient Education  [place].

## 2022-08-31 NOTE — Telephone Encounter (Signed)
Caller name: NORELLE FINKELSTEIN  On DPR?: Yes  Call back number: [phone] (mobile)  Provider they see: Leamon Arnt, MD  Reason for call: Pt is calling to switch from [facility]. Is this alright?- advise?

## 2022-08-31 NOTE — Progress Notes (Signed)
Subjective:  Patient ID: Victoria Mcintosh, female    DOB: 1980-07-22  Age: 42 y.o. MRN: [id number]  CC:  Chief Complaint  Patient presents with   Urinary Tract Infection    Pt states that she noticed pain while urination, urine was dark and noticed blood this morning. Pt states symptoms started 1 day ago.    HPI Victoria Mcintosh presents for   Dysuria Started yesterday - urination felt different.  Pain with urination, dark urine, blood noted in urine today. No fever, nausea only when painful, no new back pain or vomiting.   Last UTI few yrs ago.  No recent travel.   Tx: none.  Drinking water.   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   Periocular dermatitis 05/07/2021   Seborrheic dermatitis 05/07/2021   ASCUS of cervix with negative high risk HPV 10/01/2020   Fibrocystic breast changes of both breasts 09/23/2020   Chronic allergic rhinitis 11/07/2018   Chronic cough 10/11/2017   Past Medical History:  Diagnosis Date   GERD (gastroesophageal reflux disease)    Periocular dermatitis 05/07/2021   [place], derm   Seborrheic dermatitis 05/07/2021   [place], derm   Past Surgical History:  Procedure Laterality Date   CESAREAN SECTION     No Known Allergies Prior to Admission medications   Medication Sig Start Date End Date Taking? Authorizing Provider  Ascorbic Acid (VITAMIN C) 250 MG CHEW Chew by mouth.   Yes [provider]  BLACK ELDERBERRY,BERRY-FLOWER, PO Take 1 capsule by mouth daily. Elder berry 500 mg , Vit C 400 mg  And 125 mcg Vit D3   Yes [provider]  Coenzyme Q10 (CO Q10) 100 MG CAPS Take 1 capsule by mouth daily.   Yes [provider]  MAGNESIUM GLYCINATE PO Take 2 capsules by mouth daily in the afternoon.   Yes [provider]  Multiple Vitamins-Minerals (ALIVE WOMENS ENERGY) TABS Take 1 tablet by mouth daily.   Yes  [provider]  Omega-3 Fatty Acids (FISH OIL PO) Take 1 capsule by mouth daily in the afternoon.   Yes [provider]  TURMERIC PO Take 1,000 mg by mouth daily.   Yes [provider]   Social History   Socioeconomic History   Marital status: Married    Spouse name: Not on file   Number of children: 2   Years of education: Not on file   Highest education level: Not on file  Occupational History   Occupation: Stay at Home mom; home schools  Tobacco Use   Smoking status: Former    Packs/day: 0.25    Years: 2.00    Additional pack years: 0.00    Total pack years: 0.50    Types: Cigarettes    Quit date: 03/1999    Years since quitting: 23.5   Smokeless tobacco: Never  Vaping Use   Vaping Use: Never used  Substance and Sexual Activity   Alcohol use: Never   Drug use: Never   Sexual activity: Yes    Birth control/protection: None  Other Topics Concern   Not on file  Social History Narrative   Not on file   Social Determinants of Health   Financial Resource Strain: Not on file  Food Insecurity: Not on file  Transportation Needs: Not on file  Physical Activity: Not on file  Stress: Not on file  Social Connections: Not on  file  Intimate Partner Violence: Not on file    Review of Systems Per HPI.   Objective:   Vitals:   08/31/22 1204  BP: 100/64  Pulse: 88  Temp: 98.6 F (37 C)  TempSrc: Temporal  SpO2: 99%  Weight: 110 lb 6.4 oz (50.1 kg)  Height: 4\' 10"  (1.473 m)     Physical Exam Constitutional:      Appearance: Normal appearance. She is well-developed.  HENT:     Head: Normocephalic and atraumatic.  Pulmonary:     Effort: Pulmonary effort is normal.  Abdominal:     General: There is no distension.     Palpations: Abdomen is soft.     Tenderness: There is no abdominal tenderness. There is no guarding or rebound.  Skin:    General: Skin is warm.  Neurological:     Mental Status: She is alert and oriented to person,  place, and time.  Psychiatric:        Behavior: Behavior normal.       Results for orders placed or performed in visit on 08/31/22  POC Urinalysis Dipstick OB  Result Value Ref Range   Color, UA     Clarity, UA     Glucose, UA Negative Negative   Bilirubin, UA negative    Ketones, UA negative    Spec Grav, UA 1.010 1.010 - 1.025   Blood, UA positive    pH, UA 6.5 5.0 - 8.0   POC,PROTEIN,UA Trace Negative, Trace, Small (1+), Moderate (2+), Large (3+), 4+   Urobilinogen, UA 0.2 0.2 or 1.0 E.U./dL   Nitrite, UA negative    Leukocytes, UA Moderate (2+) (A) Negative   Appearance     Odor      Assessment & Plan:  Victoria Mcintosh is a 42 y.o. female . Burning with urination - Plan: POC Urinalysis Dipstick OB, Urine Culture, nitrofurantoin, macrocrystal-monohydrate, (MACROBID) 100 MG capsule, phenazopyridine (PYRIDIUM) 100 MG tablet, CANCELED: POCT urine qual dipstick protein  Acute cystitis with hematuria - Plan: nitrofurantoin, macrocrystal-monohydrate, (MACROBID) 100 MG capsule, phenazopyridine (PYRIDIUM) 100 MG tablet UTI, hemorrhagic cystitis.  No concerning findings on exam or history.  Start Macrobid, Pyridium as needed, check urine culture, push fluids, RTC precautions.  Meds ordered this encounter  Medications   nitrofurantoin, macrocrystal-monohydrate, (MACROBID) 100 MG capsule    Sig: Take 1 capsule (100 mg total) by mouth 2 (two) times daily.    Dispense:  14 capsule    Refill:  0   phenazopyridine (PYRIDIUM) 100 MG tablet    Sig: Take 1 tablet (100 mg total) by mouth 3 (three) times daily as needed for pain.    Dispense:  10 tablet    Refill:  0   Patient Instructions  Start antibiotic for urinary tract infection, Pyridium if needed for burning or discomfort with urination.  Drink plenty of fluids.  I expect symptoms to improve quickly within the next few days.  See information below.  Take care!  Urinary Tract Infection, Adult  A urinary tract infection (UTI) is  an infection of any part of the urinary tract. The urinary tract includes the kidneys, ureters, bladder, and urethra. These organs make, store, and get rid of urine in the body. An upper UTI affects the ureters and kidneys. A lower UTI affects the bladder and urethra. What are the causes? Most urinary tract infections are caused by bacteria in your genital area around your urethra, where urine leaves your body. These bacteria grow and cause  inflammation of your urinary tract. What increases the risk? You are more likely to develop this condition if: You have a urinary catheter that stays in place. You are not able to control when you urinate or have a bowel movement (incontinence). You are female and you: Use a spermicide or diaphragm for birth control. Have low estrogen levels. Are pregnant. You have certain genes that increase your risk. You are sexually active. You take antibiotic medicines. You have a condition that causes your flow of urine to slow down, such as: An enlarged prostate, if you are female. Blockage in your urethra. A kidney stone. A nerve condition that affects your bladder control (neurogenic bladder). Not getting enough to drink, or not urinating often. You have certain medical conditions, such as: Diabetes. A weak disease-fighting system (immunesystem). Sickle cell disease. Gout. Spinal cord injury. What are the signs or symptoms? Symptoms of this condition include: Needing to urinate right away (urgency). Frequent urination. This may include small amounts of urine each time you urinate. Pain or burning with urination. Blood in the urine. Urine that smells bad or unusual. Trouble urinating. Cloudy urine. Vaginal discharge, if you are female. Pain in the abdomen or the lower back. You may also have: Vomiting or a decreased appetite. Confusion. Irritability or tiredness. A fever or chills. Diarrhea. The first symptom in older adults may be confusion. In  some cases, they may not have any symptoms until the infection has worsened. How is this diagnosed? This condition is diagnosed based on your medical history and a physical exam. You may also have other tests, including: Urine tests. Blood tests. Tests for STIs (sexually transmitted infections). If you have had more than one UTI, a cystoscopy or imaging studies may be done to determine the cause of the infections. How is this treated? Treatment for this condition includes: Antibiotic medicine. Over-the-counter medicines to treat discomfort. Drinking enough water to stay hydrated. If you have frequent infections or have other conditions such as a kidney stone, you may need to see a health care provider who specializes in the urinary tract (urologist). In rare cases, urinary tract infections can cause sepsis. Sepsis is a life-threatening condition that occurs when the body responds to an infection. Sepsis is treated in the hospital with IV antibiotics, fluids, and other medicines. Follow these instructions at home:  Medicines Take over-the-counter and prescription medicines only as told by your health care provider. If you were prescribed an antibiotic medicine, take it as told by your health care provider. Do not stop using the antibiotic even if you start to feel better. General instructions Make sure you: Empty your bladder often and completely. Do not hold urine for long periods of time. Empty your bladder after sex. Wipe from front to back after urinating or having a bowel movement if you are female. Use each tissue only one time when you wipe. Drink enough fluid to keep your urine pale yellow. Keep all follow-up visits. This is important. Contact a health care provider if: Your symptoms do not get better after 1-2 days. Your symptoms go away and then return. Get help right away if: You have severe pain in your back or your lower abdomen. You have a fever or chills. You have nausea  or vomiting. Summary A urinary tract infection (UTI) is an infection of any part of the urinary tract, which includes the kidneys, ureters, bladder, and urethra. Most urinary tract infections are caused by bacteria in your genital area. Treatment for this condition  often includes antibiotic medicines. If you were prescribed an antibiotic medicine, take it as told by your health care provider. Do not stop using the antibiotic even if you start to feel better. Keep all follow-up visits. This is important. This information is not intended to replace advice given to you by your health care provider. Make sure you discuss any questions you have with your health care provider. Document Revised: 12/29/2019 Document Reviewed: 12/29/2019 Elsevier Patient Education  [place],   Merri Ray, MD [place], [facility] 08/31/22 12:37 PM

## 2022-08-31 NOTE — Telephone Encounter (Signed)
Patient called stating that she would like to do TOC from [facility] to [facility]. patient is aware that Dr.Greene patient panel is full. Patient has seen Dr.Greene is that past. Okay to est with Dr.Greene or schedule her with Di Kindle?

## 2022-09-01 LAB — URINE CULTURE
MICRO NUMBER:: 14766684
SPECIMEN QUALITY:: ADEQUATE

## 2022-09-01 NOTE — Telephone Encounter (Signed)
I will accept her as new patient.

## 2022-09-01 NOTE — Telephone Encounter (Signed)
Patient is scheduled   

## 2022-09-18 ENCOUNTER — Encounter: Payer: Self-pay | Admitting: Family Medicine

## 2022-09-18 ENCOUNTER — Ambulatory Visit (INDEPENDENT_AMBULATORY_CARE_PROVIDER_SITE_OTHER): Payer: Managed Care, Other (non HMO) | Admitting: Family Medicine

## 2022-09-18 VITALS — BP 120/68 | HR 69 | Temp 98.2°F | Ht <= 58 in | Wt 110.0 lb

## 2022-09-18 DIAGNOSIS — J309 Allergic rhinitis, unspecified: Secondary | ICD-10-CM

## 2022-09-18 DIAGNOSIS — M7989 Other specified soft tissue disorders: Secondary | ICD-10-CM | POA: Diagnosis not present

## 2022-09-18 DIAGNOSIS — M79641 Pain in right hand: Secondary | ICD-10-CM

## 2022-09-18 DIAGNOSIS — Z87440 Personal history of urinary (tract) infections: Secondary | ICD-10-CM

## 2022-09-18 DIAGNOSIS — M1280 Other specific arthropathies, not elsewhere classified, unspecified site: Secondary | ICD-10-CM

## 2022-09-18 NOTE — Progress Notes (Signed)
Subjective:  Patient ID: VIVIANNA PICCINI, female    DOB: 11/23/1980  Age: 42 y.o. MRN: [id number]  CC:  Chief Complaint  Patient presents with   Establish Care    Pt has no concerns     HPI Ezrie R Beaulieu presents for   establishing with me as her primary care provider.  Previously under the care of Dr. Mardelle Matte. Better location here.   History of allergic rhinitis, seborrheic dermatitis, inflammatory arthritis per problem list.  HLA-B27 positive arthropathy.  She is not on any prescription medications at this time.  Last physical in April 2023.  Repeat Pap in 2025 with history of ASCUS Pap.  As needed NSAIDs for her inflammatory arthropathy.  Pain in hands past year, some swelling past year, no meds. Last saw rheum about a year ago. Dr. Kathi Ludwig.   History of fibrocystic breast changes, mammogram recommended at her last physical. Has not scheduled.  Min allergy symptoms, no meds needed.   I recently saw her April 1 with UTI/dysuria.  Urine culture with mixed genital flora, was initially treated with Pyridium, Macrobid for suspected cystitis. Symptoms have resolved.    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   Periocular dermatitis 05/07/2021   Seborrheic dermatitis 05/07/2021   ASCUS of cervix with negative high risk HPV 10/01/2020   Fibrocystic breast changes of both breasts 09/23/2020   Chronic allergic rhinitis 11/07/2018   Chronic cough 10/11/2017   Past Medical History:  Diagnosis Date   GERD (gastroesophageal reflux disease)    Periocular dermatitis 05/07/2021   [place], derm   Seborrheic dermatitis 05/07/2021   [place], derm   Past Surgical History:  Procedure Laterality Date   CESAREAN SECTION     No Known Allergies Prior to Admission medications   Medication Sig Start Date End Date Taking? Authorizing Provider  Ascorbic Acid (VITAMIN C) 250 MG CHEW Chew by mouth.   Yes  [provider]  BLACK ELDERBERRY,BERRY-FLOWER, PO Take 1 capsule by mouth daily. Elder berry 500 mg , Vit C 400 mg  And 125 mcg Vit D3   Yes [provider]  Coenzyme Q10 (CO Q10) 100 MG CAPS Take 1 capsule by mouth daily.   Yes [provider]  MAGNESIUM GLYCINATE PO Take 2 capsules by mouth daily in the afternoon.   Yes [provider]  Multiple Vitamins-Minerals (ALIVE WOMENS ENERGY) TABS Take 1 tablet by mouth daily.   Yes [provider]  nitrofurantoin, macrocrystal-monohydrate, (MACROBID) 100 MG capsule Take 1 capsule (100 mg total) by mouth 2 (two) times daily. 08/31/22  Yes Shade Flood, MD  Omega-3 Fatty Acids (FISH OIL PO) Take 1 capsule by mouth daily in the afternoon.   Yes [provider]  phenazopyridine (PYRIDIUM) 100 MG tablet Take 1 tablet (100 mg total) by mouth 3 (three) times daily as needed for pain. 08/31/22  Yes Shade Flood, MD  TURMERIC PO Take 1,000 mg by mouth daily.   Yes [provider]   Social History   Socioeconomic History   Marital status: Married    Spouse name: Not on file   Number of children: 2   Years of education: Not on file   Highest education level: Not on file  Occupational History   Occupation: Stay at Home mom; home schools  Tobacco Use   Smoking status: Former    Packs/day: 0.25    Years:  2.00    Additional pack years: 0.00    Total pack years: 0.50    Types: Cigarettes    Quit date: 03/1999    Years since quitting: 23.5   Smokeless tobacco: Never  Vaping Use   Vaping Use: Never used  Substance and Sexual Activity   Alcohol use: Never   Drug use: Never   Sexual activity: Yes    Birth control/protection: None  Other Topics Concern   Not on file  Social History Narrative   Not on file   Social Determinants of Health   Financial Resource Strain: Not on file  Food Insecurity: Not on file  Transportation Needs: Not on file  Physical Activity: Not on file   Stress: Not on file  Social Connections: Not on file  Intimate Partner Violence: Not on file    Review of Systems   Objective:   Vitals:   09/18/22 1149  BP: 120/68  Pulse: 69  Temp: 98.2 F (36.8 C)  TempSrc: Temporal  SpO2: 99%  Weight: 110 lb (49.9 kg)  Height:  (1.473 m)     Physical Exam Vitals reviewed.  Constitutional:      Appearance: Normal appearance. She is well-developed.  HENT:     Head: Normocephalic and atraumatic.  Eyes:     Conjunctiva/sclera: Conjunctivae normal.     Pupils: Pupils are equal, round, and reactive to light.  Neck:     Vascular: No carotid bruit.  Cardiovascular:     Rate and Rhythm: Normal rate and regular rhythm.     Heart sounds: Normal heart sounds.  Pulmonary:     Effort: Pulmonary effort is normal.     Breath sounds: Normal breath sounds.  Abdominal:     Palpations: Abdomen is soft. There is no pulsatile mass.     Tenderness: There is no abdominal tenderness.  Musculoskeletal:     Right lower leg: No edema.     Left lower leg: No edema.     Comments: R hand - focal firm swelling at base of 2nd MC, distal row. Decreased wrist flexion d/t pain. No erythema - see photo.   Skin:    General: Skin is warm and dry.  Neurological:     Mental Status: She is alert and oriented to person, place, and time.  Psychiatric:        Mood and Affect: Mood normal.        Behavior: Behavior normal.      Assessment & Plan:  TESLA KEELER is a 42 y.o. female . HLA-B27 positive arthropathy Right hand pain Swelling of hand  - at least 1 year of symptoms, suspect inflammatory arthropathy as cause, synovitis. Advised follow up with rheumatology to decide on further testing/imaging or intervention, initial topical nsaid as option.   History of urinary infection  - no specific bacteria on urine cx. Symptoms resolved. Rtc precautions if recurrence.   Allergic rhinitis, unspecified seasonality, unspecified trigger  - mild, stable, no  new meds.   HM: Mammogram recommended. Plan on CPE in 6 months.   No orders of the defined types were placed in this encounter.  Patient Instructions  I recommend meeting with Dr. Kathi Ludwig about the hand pains and swelling and options. Let me know if they need a referral.  In the meantime can try Voltaren gel over-the-counter to the sore area to see if that provides relief.  We recommend that you schedule a mammogram for breast cancer screening. Typically, you do not  need a referral to do this. Please contact a local imaging center to schedule your mammogram. [facility]) - [phone] or [phone]) [phone]   Follow-up in 6 months for a physical but please let me know if there are questions in the meantime.  Take care.     Signed,   Meredith Staggers, MD [facility], [facility] 09/18/22 12:32 PM

## 2022-09-18 NOTE — Patient Instructions (Addendum)
I recommend meeting with Dr. Kathi Ludwig about the hand pains and swelling and options. Let me know if they need a referral.  In the meantime can try Voltaren gel over-the-counter to the sore area to see if that provides relief.  We recommend that you schedule a mammogram for breast cancer screening. Typically, you do not need a referral to do this. Please contact a local imaging center to schedule your mammogram. [facility]) - [phone] or [phone]) [phone]   Follow-up in 6 months for a physical but please let me know if there are questions in the meantime.  Take care.

## 2022-11-19 ENCOUNTER — Other Ambulatory Visit: Payer: Self-pay

## 2022-11-19 ENCOUNTER — Ambulatory Visit: Payer: Managed Care, Other (non HMO) | Admitting: Family Medicine

## 2022-11-19 ENCOUNTER — Ambulatory Visit: Payer: Managed Care, Other (non HMO)

## 2022-11-19 VITALS — BP 114/72 | HR 75 | Ht <= 58 in | Wt 112.0 lb

## 2022-11-19 DIAGNOSIS — M25461 Effusion, right knee: Secondary | ICD-10-CM | POA: Diagnosis not present

## 2022-11-19 DIAGNOSIS — M25531 Pain in right wrist: Secondary | ICD-10-CM

## 2022-11-19 DIAGNOSIS — M25561 Pain in right knee: Secondary | ICD-10-CM

## 2022-11-19 DIAGNOSIS — G8929 Other chronic pain: Secondary | ICD-10-CM | POA: Diagnosis not present

## 2022-11-19 MED ORDER — TRAMADOL HCL 50 MG PO TABS: 50.0000 mg | ORAL_TABLET | Freq: Three times a day (TID) | ORAL | 0 refills | Status: DC | PRN

## 2022-11-19 MED ORDER — MELOXICAM 15 MG PO TABS: 15.0000 mg | ORAL_TABLET | Freq: Every day | ORAL | 0 refills | Status: DC | PRN

## 2022-11-19 NOTE — Progress Notes (Signed)
Rubin Payor, PhD, LAT, ATC acting as a scribe for Victoria Graham, MD.  Victoria Mcintosh is a 42 y.o. female who presents to [facility] today for exacerbation of her R knee pain. Pt was last seen by Dr. Denyse Amass on 02/12/21 and her R knee was aspirated and injection w/ a steroid, labs/cultures were obtained. Based on lab results, she was referred to rheumatology.  Today, pt reports R knee is only slightly painful, but she feels swollen. She notes being seen by Dr. Berton Lan a year ago, who did a MRI revealing a meniscal tear.   Pt called the office at 2:00PM reporting increased pain in her R hand/wrist that started about 30 mins after the injection. She states that the area is swollen and pain is now shooting up into her forearm. She was advised to return to the clinic for further evaluation.   Dx testing: 02/12/21 Labs & synovial fluid culture  11/13/20 R knee XR   Pertinent review of systems: No fevers or chills  Relevant historical information: Inflammatory arthritis was seen by rheumatology 2 years ago and has been subsequently somewhat lost to follow-up.   Exam:  BP 114/72   Pulse 75   Ht 4\' 10"  (1.473 m)   Wt 112 lb (50.8 kg)   SpO2 99%   BMI 23.41 kg/m  General: Well Developed, well nourished, and in no acute distress.   MSK: Right wrist moderate effusion normal-appearing otherwise.  Decreased range of motion.  Tender palpation dorsal wrist.  Right knee large effusion.  Mildly tender palpation medial joint line. Normal motion.    Lab and Radiology Results  Procedure: Real-time Ultrasound Guided aspiration and injection right knee joint a lateral approach Device: Philips Affiniti 50G Images permanently stored and available for review in PACS Verbal informed consent obtained.  Discussed risks and benefits of procedure. Warned about infection, bleeding, hyperglycemia damage to structures among others. Patient expresses understanding and  agreement Time-out conducted.   Noted no overlying erythema, induration, or other signs of local infection.   Skin prepped in a sterile fashion.   Local anesthesia: Topical Ethyl chloride.   With sterile technique and under real time ultrasound guidance: 2 mL of lidocaine injected into the subcutaneous tissue at the lateral knee achieving good anesthesia. Skin again sterilized with isopropyl alcohol and 18-gauge needle used to access the knee joint. 50 mL of clear yellow fluid aspirated.  Syringe was exchanged and 40 mg of Kenalog and 2 mL of Marcaine were injected in the knee joint.   Advised to call if fevers/chills, erythema, induration, drainage, or persistent bleeding.   Images permanently stored and available for review in the ultrasound unit.  Impression: Technically successful ultrasound guided injection.    Procedure: Real-time Ultrasound Guided Injection of right wrist dorsal approach Device: Philips Affiniti 50G Images permanently stored and available for review in PACS Verbal informed consent obtained.  Discussed risks and benefits of procedure. Warned about infection, bleeding, hyperglycemia damage to structures among others. Patient expresses understanding and agreement Time-out conducted.   Noted no overlying erythema, induration, or other signs of local infection.   Skin prepped in a sterile fashion.   Local anesthesia: Topical Ethyl chloride.   With sterile technique and under real time ultrasound guidance: 40 mg of Kenalog and 1 mL of lidocaine injected into dorsal wrist joint. Fluid seen entering the joint capsule.   Completed without difficulty   Pain immediately resolved suggesting accurate placement of the medication.  Advised to call if fevers/chills, erythema, induration, drainage, or persistent bleeding.   Images permanently stored and available for review in the ultrasound unit.  Impression: Technically successful ultrasound guided injection.    Ultrasound  evaluation on recheck after patient return to clinic shows similar appearance wrist effusion.  No large hematoma visible.  X-ray images right wrist obtained today personally and independently interpreted. No acute fractures.  No severe degenerative changes. Await formal radiology review  Assessment and Plan: 42 y.o. female with right knee pain and effusion.  Patient does have some degenerative changes but has recurrent knee effusion.  She does have positive HLA-B27 and concern for inflammatory arthritis.  Plan for aspiration injection today and refer back to rheumatology.  Right wrist pain and effusion thought to be primarily due to inflammatory arthritis.  Recommend return back to rheumatology for recheck.  Steroid injection given today in clinic.  She had pain about an hour after the injection which I think is due to the numbing medicine portion of the injection wearing off.  She had a moderate wrist effusion before I did the injection and certainly has a large 1 afterwards and I think the increased pressure in her wrist joint is probably what she is feeling.  I provided a volar slab splints meloxicam prescription and backup tramadol prescription for use if needed.  We talked about precautions.  She will let me know how she feels in the next day.   PDMP reviewed during this encounter. Orders Placed This Encounter  Procedures   [place] LIMITED JOINT SPACE STRUCTURES UP RIGHT(NO LINKED CHARGES)    Order Specific Question:   Reason for Exam (SYMPTOM  OR DIAGNOSIS REQUIRED)    Answer:   wrist pain r    Order Specific Question:   Preferred imaging location?    Answer:   [facility] Wrist Complete Right    Standing Status:   Future    Number of Occurrences:   1    Standing Expiration Date:   12/19/2022    Order Specific Question:   Reason for Exam (SYMPTOM  OR DIAGNOSIS REQUIRED)    Answer:   right wrist pain    Order Specific Question:   Preferred imaging location?     Answer:   [facility]    Order Specific Question:   Is patient pregnant?    Answer:   No   Meds ordered this encounter  Medications   traMADol (ULTRAM) 50 MG tablet    Sig: Take 1 tablet (50 mg total) by mouth every 8 (eight) hours as needed for severe pain.    Dispense:  8 tablet    Refill:  0   meloxicam (MOBIC) 15 MG tablet    Sig: Take 1 tablet (15 mg total) by mouth daily as needed for pain.    Dispense:  15 tablet    Refill:  0     Discussed warning signs or symptoms. Please see discharge instructions. Patient expresses understanding.   The above documentation has been reviewed and is accurate and complete Victoria Mcintosh, M.D.

## 2022-11-19 NOTE — Patient Instructions (Addendum)
Thank you for coming in today.   Please get an Xray today before you leave   You received an injection today. Seek immediate medical attention if the joint becomes red, extremely painful, or is oozing fluid.   Please call and schedule a follow up visit with your rheumatology: [facility] Phone: [phone]  Check back with me as needed

## 2022-11-23 NOTE — Progress Notes (Signed)
Right wrist x-ray shows some wrist swelling and some old changes in the wrist that could cause it to hurt.  This is the positive ulnar variance.  However no explanation for the severe pain and swelling you are experiencing is present on this x-ray.

## 2022-12-14 ENCOUNTER — Encounter: Payer: Self-pay | Admitting: Family Medicine

## 2022-12-14 ENCOUNTER — Ambulatory Visit (INDEPENDENT_AMBULATORY_CARE_PROVIDER_SITE_OTHER): Payer: Managed Care, Other (non HMO) | Admitting: Family Medicine

## 2022-12-14 VITALS — BP 114/60 | HR 81 | Temp 98.0°F | Ht <= 58 in | Wt 110.0 lb

## 2022-12-14 DIAGNOSIS — M26622 Arthralgia of left temporomandibular joint: Secondary | ICD-10-CM

## 2022-12-14 NOTE — Patient Instructions (Signed)
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  See information below.  Try to avoid excessive chewing or forceful chewing temporarily, gentle range of motion and stretches of jaw as we discussed.  Meloxicam that you have at home can be taken once per day, and I expect symptoms to be improving in the next week to week and a half.  Keep me posted.  If any new or worsening symptoms sooner, be seen.  Take care.   Temporomandibular Joint Syndrome  Temporomandibular joint syndrome (TMJ syndrome) is a condition that causes pain in the temporomandibular joints. These joints are located near your ears and allow your jaw to open and close. For people with TMJ syndrome, chewing, biting, or other movements of the jaw can be difficult or painful. TMJ syndrome is often mild and goes away within a few weeks. However, sometimes the condition becomes a long-term (chronic) problem. What are the causes? This condition may be caused by: Grinding your teeth or clenching your jaw. Some people do this when they are stressed. Arthritis. An injury to the jaw. A head or neck injury. Teeth or dentures that are not aligned well. In some cases, the cause of TMJ syndrome may not be known. What are the signs or symptoms? The most common symptom of this condition is aching pain on the side of the head in the area of the TMJ. Other symptoms may include: Pain when moving your jaw, such as when chewing or biting. Not being able to open your jaw all the way. Making a clicking sound when you open your mouth. Headache. Earache. Neck or shoulder pain. How is this diagnosed? This condition may be diagnosed based on: Your symptoms and medical history. A physical exam. Your health care provider may check the range of motion of your jaw. Imaging tests, such as X-rays or an MRI. You may also need to see your dentist, who will check if your teeth and jaw are lined up correctly. How is this treated? TMJ  syndrome often goes away on its own. If treatment is needed, it may include: Eating soft foods and applying ice or heat. Medicines to relieve pain or inflammation. Medicines or massage to relax the muscles. A splint, bite plate, or mouthpiece to prevent teeth grinding or jaw clenching. Relaxation techniques or counseling to help reduce stress. A therapy for pain in which an electrical current is applied to the nerves through the skin (transcutaneous electrical nerve stimulation). Acupuncture. This may help to relieve pain. Jaw surgery. This is rarely needed. Follow these instructions at home:  Eating and drinking Eat a soft diet if you are having trouble chewing. Avoid foods that require a lot of chewing. Do not chew gum. General instructions Take over-the-counter and prescription medicines only as told by your health care provider. If directed, put ice on the painful area. To do this: Put ice in a plastic bag. Place a towel between your skin and the bag. Leave the ice on for 20 minutes, 2-3 times a day. Remove the ice if your skin turns bright red. This is very important. If you cannot feel pain, heat, or cold, you have a greater risk of damage to the area. Apply a warm, wet cloth (warm compress) to the painful area as told. Massage your jaw area and do any jaw stretching exercises as told by your health care provider. If you were given a splint, bite plate, or mouthpiece, wear it as told by your health care provider.  Keep all follow-up visits. This is important. Where to find more information [employer]: [employer] Contact a health care provider if: You have trouble eating. You have new or worsening symptoms. Get help right away if: Your jaw locks. Summary Temporomandibular joint syndrome (TMJ syndrome) is a condition that causes pain in the temporomandibular joints. These joints are located near your ears and allow your jaw to  open and close. TMJ syndrome is often mild and goes away within a few weeks. However, sometimes the condition becomes a long-term (chronic) problem. Symptoms include an aching pain on the side of the head in the area of the TMJ, pain when chewing or biting, and being unable to open your jaw all the way. You may also make a clicking sound when you open your mouth. TMJ syndrome often goes away on its own. If treatment is needed, it may include medicines to relieve pain, reduce inflammation, or relax the muscles. A splint, bite plate, or mouthpiece may also be used to prevent teeth grinding or jaw clenching. This information is not intended to replace advice given to you by your health care provider. Make sure you discuss any questions you have with your health care provider. Document Revised: 12/29/2020 Document Reviewed: 12/29/2020 Elsevier Patient Education  2024 [employer].

## 2022-12-14 NOTE — Progress Notes (Signed)
Subjective:  Patient ID: Victoria Mcintosh, female    DOB: 1980/11/03  Age: 42 y.o. MRN: [id number]  CC:  Chief Complaint  Patient presents with   Headache    Notes a pain in her neck on the Lt about 3 weeks ago then started moving higher and nor has pressure in her cheek/jaw/ into the ear area, denied sore throat and congestion or drainage.     HPI Victoria Mcintosh presents for   Headache, neck pain Started about 10 days ago. Top of left jaw. In front of the left ear. No pain in ear, no ear discharge or change in hearing.  No change in activity. No injury.  Noticed pain with opening mouth/eating. Slight left HA for a few minutes, otherwise no HA or neck pain.  Sore to open mouth, chew. No tooth pain or hot/cold sensitivity.  Sore this morning, better today.  No fever/chills.  Tx: none. Prior rx for Mobic on 6/20 - took for 1 day, no recent use.   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   Periocular dermatitis 05/07/2021   Seborrheic dermatitis 05/07/2021   ASCUS of cervix with negative high risk HPV 10/01/2020   Fibrocystic breast changes of both breasts 09/23/2020   Chronic allergic rhinitis 11/07/2018   Chronic cough 10/11/2017   Past Medical History:  Diagnosis Date   GERD (gastroesophageal reflux disease)    Periocular dermatitis 05/07/2021   [place], derm   Seborrheic dermatitis 05/07/2021   [place], derm   Past Surgical History:  Procedure Laterality Date   CESAREAN SECTION     No Known Allergies Prior to Admission medications   Medication Sig Start Date End Date Taking? Authorizing Provider  Ascorbic Acid (VITAMIN C) 250 MG CHEW Chew by mouth.   Yes [provider]  BLACK ELDERBERRY,BERRY-FLOWER, PO Take 1 capsule by mouth daily. Elder berry 500 mg , Vit C 400 mg  And 125 mcg Vit D3   Yes [provider]  Coenzyme Q10 (CO Q10) 100 MG CAPS Take 1  capsule by mouth daily.   Yes [provider]  MAGNESIUM GLYCINATE PO Take 2 capsules by mouth daily in the afternoon.   Yes [provider]  meloxicam (MOBIC) 15 MG tablet Take 1 tablet (15 mg total) by mouth daily as needed for pain. 11/19/22  Yes Rodolph Bong, MD  Multiple Vitamins-Minerals (ALIVE WOMENS ENERGY) TABS Take 1 tablet by mouth daily.   Yes [provider]  nitrofurantoin, macrocrystal-monohydrate, (MACROBID) 100 MG capsule Take 1 capsule (100 mg total) by mouth 2 (two) times daily. 08/31/22  Yes Shade Flood, MD  Omega-3 Fatty Acids (FISH OIL PO) Take 1 capsule by mouth daily in the afternoon.   Yes [provider]  phenazopyridine (PYRIDIUM) 100 MG tablet Take 1 tablet (100 mg total) by mouth 3 (three) times daily as needed for pain. 08/31/22  Yes Shade Flood, MD  traMADol (ULTRAM) 50 MG tablet Take 1 tablet (50 mg total) by mouth every 8 (eight) hours as needed for severe pain. 11/19/22  Yes Rodolph Bong, MD  TURMERIC PO Take 1,000 mg by mouth daily.   Yes [provider]   Social History   Socioeconomic History   Marital status: Married    Spouse name: Not on file   Number of children: 2   Years of education: Not on file   Highest  education level: Not on file  Occupational History   Occupation: Stay at Home mom; home schools  Tobacco Use   Smoking status: Former    Current packs/day: 0.00    Average packs/day: 0.3 packs/day for 2.0 years (0.5 ttl pk-yrs)    Types: Cigarettes    Start date: 03/1997    Quit date: 03/1999    Years since quitting: 23.8   Smokeless tobacco: Never  Vaping Use   Vaping status: Never Used  Substance and Sexual Activity   Alcohol use: Never   Drug use: Never   Sexual activity: Yes    Birth control/protection: None  Other Topics Concern   Not on file  Social History Narrative   Not on file   Social Determinants of Health   Financial Resource Strain: Not on file  Food Insecurity:  Not on file  Transportation Needs: Not on file  Physical Activity: Not on file  Stress: Not on file  Social Connections: Not on file  Intimate Partner Violence: Not on file    Review of Systems Per HPI.   Objective:   Vitals:   12/14/22 1609  BP: 114/60  Pulse: 81  Temp: 98 F (36.7 C)  TempSrc: Temporal  SpO2: 97%  Weight: 110 lb (49.9 kg)  Height: 4\' 10"  (1.473 m)     Physical Exam Vitals reviewed.  Constitutional:      General: She is not in acute distress.    Appearance: She is well-developed.  HENT:     Head: Normocephalic and atraumatic.      Right Ear: Hearing, tympanic membrane, ear canal and external ear normal.     Left Ear: Hearing, tympanic membrane, ear canal and external ear normal.     Nose: Nose normal.     Mouth/Throat:     Pharynx: No posterior oropharyngeal erythema.  Eyes:     Conjunctiva/sclera: Conjunctivae normal.     Pupils: Pupils are equal, round, and reactive to light.  Cardiovascular:     Rate and Rhythm: Normal rate and regular rhythm.     Heart sounds: Normal heart sounds. No murmur heard. Pulmonary:     Effort: Pulmonary effort is normal. No respiratory distress.     Breath sounds: Normal breath sounds. No wheezing or rhonchi.  Skin:    General: Skin is warm and dry.     Findings: No rash.  Neurological:     Mental Status: She is alert and oriented to person, place, and time.  Psychiatric:        Mood and Affect: Mood normal.        Behavior: Behavior normal.    C-spine nontender, pain-free motion of cervical spine.  Scalp and sinuses nontender.    Assessment & Plan:  Victoria Mcintosh is a 42 y.o. female . 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  No sore throat, unlikely infection and no preceding dental pain or temperature sensitivity, unlikely deep space infection or odontogenic  infection.  -Has meloxicam at home, recommended taking Mobic daily for now, avoid excessive chewing or hard foods for now, handout given.  Range of motion, gentle jaw stretches reviewed in office.  RTC precautions given.  No orders of the defined types were placed in this encounter.  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  See information below.  Try to avoid excessive chewing or  forceful chewing temporarily, gentle range of motion and stretches of jaw as we discussed.  Meloxicam that you have at home can be taken once per day, and I expect symptoms to be improving in the next week to week and a half.  Keep me posted.  If any new or worsening symptoms sooner, be seen.  Take care.   Temporomandibular Joint Syndrome  Temporomandibular joint syndrome (TMJ syndrome) is a condition that causes pain in the temporomandibular joints. These joints are located near your ears and allow your jaw to open and close. For people with TMJ syndrome, chewing, biting, or other movements of the jaw can be difficult or painful. TMJ syndrome is often mild and goes away within a few weeks. However, sometimes the condition becomes a long-term (chronic) problem. What are the causes? This condition may be caused by: Grinding your teeth or clenching your jaw. Some people do this when they are stressed. Arthritis. An injury to the jaw. A head or neck injury. Teeth or dentures that are not aligned well. In some cases, the cause of TMJ syndrome may not be known. What are the signs or symptoms? The most common symptom of this condition is aching pain on the side of the head in the area of the TMJ. Other symptoms may include: Pain when moving your jaw, such as when chewing or biting. Not being able to open your jaw all the way. Making a clicking sound when you open your mouth. Headache. Earache. Neck or shoulder pain. How is this diagnosed? This condition may be  diagnosed based on: Your symptoms and medical history. A physical exam. Your health care provider may check the range of motion of your jaw. Imaging tests, such as X-rays or an MRI. You may also need to see your dentist, who will check if your teeth and jaw are lined up correctly. How is this treated? TMJ syndrome often goes away on its own. If treatment is needed, it may include: Eating soft foods and applying ice or heat. Medicines to relieve pain or inflammation. Medicines or massage to relax the muscles. A splint, bite plate, or mouthpiece to prevent teeth grinding or jaw clenching. Relaxation techniques or counseling to help reduce stress. A therapy for pain in which an electrical current is applied to the nerves through the skin (transcutaneous electrical nerve stimulation). Acupuncture. This may help to relieve pain. Jaw surgery. This is rarely needed. Follow these instructions at home:  Eating and drinking Eat a soft diet if you are having trouble chewing. Avoid foods that require a lot of chewing. Do not chew gum. General instructions Take over-the-counter and prescription medicines only as told by your health care provider. If directed, put ice on the painful area. To do this: Put ice in a plastic bag. Place a towel between your skin and the bag. Leave the ice on for 20 minutes, 2-3 times a day. Remove the ice if your skin turns bright red. This is very important. If you cannot feel pain, heat, or cold, you have a greater risk of damage to the area. Apply a warm, wet cloth (warm compress) to the painful area as told. Massage your jaw area and do any jaw stretching exercises as told by your health care provider. If you were given a splint, bite plate, or mouthpiece, wear it as told by your health care provider. Keep all follow-up visits. This is important. Where to find more information [employer]: [employer] Contact a  health care provider if: You have trouble eating. You have new or worsening symptoms. Get help right away if: Your jaw locks. Summary Temporomandibular joint syndrome (TMJ syndrome) is a condition that causes pain in the temporomandibular joints. These joints are located near your ears and allow your jaw to open and close. TMJ syndrome is often mild and goes away within a few weeks. However, sometimes the condition becomes a long-term (chronic) problem. Symptoms include an aching pain on the side of the head in the area of the TMJ, pain when chewing or biting, and being unable to open your jaw all the way. You may also make a clicking sound when you open your mouth. TMJ syndrome often goes away on its own. If treatment is needed, it may include medicines to relieve pain, reduce inflammation, or relax the muscles. A splint, bite plate, or mouthpiece may also be used to prevent teeth grinding or jaw clenching. This information is not intended to replace advice given to you by your health care provider. Make sure you discuss any questions you have with your health care provider. Document Revised: 12/29/2020 Document Reviewed: 12/29/2020 Elsevier Patient Education  2024 [employer]       Signed,   Meredith Staggers, MD [facility], [facility] 12/14/22 4:48 PM

## 2023-03-22 ENCOUNTER — Encounter: Payer: Self-pay | Admitting: Family Medicine

## 2023-03-22 ENCOUNTER — Ambulatory Visit: Payer: Managed Care, Other (non HMO) | Admitting: Family Medicine

## 2023-03-22 VITALS — BP 118/78 | HR 82 | Temp 98.1°F | Ht <= 58 in | Wt 112.4 lb

## 2023-03-22 DIAGNOSIS — Z13220 Encounter for screening for lipoid disorders: Secondary | ICD-10-CM | POA: Diagnosis not present

## 2023-03-22 DIAGNOSIS — Z131 Encounter for screening for diabetes mellitus: Secondary | ICD-10-CM | POA: Diagnosis not present

## 2023-03-22 DIAGNOSIS — Z0000 Encounter for general adult medical examination without abnormal findings: Secondary | ICD-10-CM | POA: Diagnosis not present

## 2023-03-22 NOTE — Patient Instructions (Signed)
Thanks for coming in today.  If any concerns on your labs I will let you know.  Goal exercise 150 minutes/week.  I do recommend mammogram but leave that up to you.  See other information regarding health maintenance below.  Follow-up with me in 1 year for physical but let me know if there are any questions sooner.  Take care!  Preventive Care 33-43 Years Old, Female Preventive care refers to lifestyle choices and visits with your health care provider that can promote health and wellness. Preventive care visits are also called wellness exams. What can I expect for my preventive care visit? Counseling Your health care provider may ask you questions about your: Medical history, including: Past medical problems. Family medical history. Pregnancy history. Current health, including: Menstrual cycle. Method of birth control. Emotional well-being. Home life and relationship well-being. Sexual activity and sexual health. Lifestyle, including: Alcohol, nicotine or tobacco, and drug use. Access to firearms. Diet, exercise, and sleep habits. Work and work Astronomer. Sunscreen use. Safety issues such as seatbelt and bike helmet use. Physical exam Your health care provider will check your: Height and weight. These may be used to calculate your BMI (body mass index). BMI is a measurement that tells if you are at a healthy weight. Waist circumference. This measures the distance around your waistline. This measurement also tells if you are at a healthy weight and may help predict your risk of certain diseases, such as type 2 diabetes and high blood pressure. Heart rate and blood pressure. Body temperature. Skin for abnormal spots. What immunizations do I need?  Vaccines are usually given at various ages, according to a schedule. Your health care provider will recommend vaccines for you based on your age, medical history, and lifestyle or other factors, such as travel or where you work. What tests  do I need? Screening Your health care provider may recommend screening tests for certain conditions. This may include: Lipid and cholesterol levels. Diabetes screening. This is done by checking your blood sugar (glucose) after you have not eaten for a while (fasting). Pelvic exam and Pap test. Hepatitis B test. Hepatitis C test. HIV (human immunodeficiency virus) test. STI (sexually transmitted infection) testing, if you are at risk. Lung cancer screening. Colorectal cancer screening. Mammogram. Talk with your health care provider about when you should start having regular mammograms. This may depend on whether you have a family history of breast cancer. BRCA-related cancer screening. This may be done if you have a family history of breast, ovarian, tubal, or peritoneal cancers. Bone density scan. This is done to screen for osteoporosis. Talk with your health care provider about your test results, treatment options, and if necessary, the need for more tests. Follow these instructions at home: Eating and drinking  Eat a diet that includes fresh fruits and vegetables, whole grains, lean protein, and low-fat dairy products. Take vitamin and mineral supplements as recommended by your health care provider. Do not drink alcohol if: Your health care provider tells you not to drink. You are pregnant, may be pregnant, or are planning to become pregnant. If you drink alcohol: Limit how much you have to 0-1 drink a day. Know how much alcohol is in your drink. In the [place] one drink equals one 12 oz bottle of beer (355 mL), one 5 oz glass of wine (148 mL), or one 1 oz glass of hard liquor (44 mL). Lifestyle Brush your teeth every morning and night with fluoride toothpaste. Floss one time each day. Exercise  for at least 30 minutes 5 or more days each week. Do not use any products that contain nicotine or tobacco. These products include cigarettes, chewing tobacco, and vaping devices, such as  e-cigarettes. If you need help quitting, ask your health care provider. Do not use drugs. If you are sexually active, practice safe sex. Use a condom or other form of protection to prevent STIs. If you do not wish to become pregnant, use a form of birth control. If you plan to become pregnant, see your health care provider for a prepregnancy visit. Take aspirin only as told by your health care provider. Make sure that you understand how much to take and what form to take. Work with your health care provider to find out whether it is safe and beneficial for you to take aspirin daily. Find healthy ways to manage stress, such as: Meditation, yoga, or listening to music. Journaling. Talking to a trusted person. Spending time with friends and family. Minimize exposure to UV radiation to reduce your risk of skin cancer. Safety Always wear your seat belt while driving or riding in a vehicle. Do not drive: If you have been drinking alcohol. Do not ride with someone who has been drinking. When you are tired or distracted. While texting. If you have been using any mind-altering substances or drugs. Wear a helmet and other protective equipment during sports activities. If you have firearms in your house, make sure you follow all gun safety procedures. Seek help if you have been physically or sexually abused. What's next? Visit your health care provider once a year for an annual wellness visit. Ask your health care provider how often you should have your eyes and teeth checked. Stay up to date on all vaccines. This information is not intended to replace advice given to you by your health care provider. Make sure you discuss any questions you have with your health care provider. Document Revised: 11/13/2020 Document Reviewed: 11/13/2020 Elsevier Patient Education  2024 [employer].

## 2023-03-22 NOTE — Progress Notes (Signed)
Subjective:  Patient ID: Victoria Mcintosh, female    DOB: Oct 26, 1980  Age: 42 y.o. MRN: [id number]  CC:  Chief Complaint  Patient presents with   Annual Exam    6 month f/u. No other concerns. Just wants full lipid panel for labs done.    HPI Victoria Mcintosh presents for Annual Exam PCP, me  No specific concerns.   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  Rheumatology previously Dr. Kathi Ludwig.  No recent nsaids. Prior hand pains improved. Less sugar has improved inflammation. On MVI, turmeric, some other supplements - Krill oil, coQ10, magnesium, vit C.   Allergic rhinitis History of mild allergy symptoms, no chronic meds needed. Stable.      03/22/2023    1:17 PM 12/14/2022    4:08 PM 09/18/2022   11:49 AM 08/31/2022   11:55 AM 12/25/2021    1:05 PM  Depression screen PHQ 2/9  Decreased Interest 0 1 0 0 0  Down, Depressed, Hopeless 0 0 0 0 0  PHQ - 2 Score 0 1 0 0 0  Altered sleeping  0 1 3 0  Tired, decreased energy  1 1 0 1  Change in appetite  0 0 0 1  Feeling bad or failure about yourself   0 0 2 0  Trouble concentrating  1 2 0 0  Moving slowly or fidgety/restless  0 0 0 2  Suicidal thoughts  0 0 0 0  PHQ-9 Score  3 4 5 4   Difficult doing work/chores   Not difficult at all Not difficult at all Not difficult at all    Health Maintenance  Topic Date Due   Cervical Cancer Screening (HPV/Pap Cotest)  09/23/2025   DTaP/Tdap/Td (2 - Td or Tdap) 09/24/2030   Hepatitis C Screening  Completed   HIV Screening  Completed   HPV VACCINES  Aged Out   INFLUENZA VACCINE  Discontinued   COVID-19 Vaccine  Discontinued  No FH of colon CA.  Overdue for mammogram. Not sure if she will have one. Recommended. Hx of fibrocystic breast changes.  Pap due in 2025 - hx of ascus pap in April 2022. Negative for HR HPV.  No dermatologist - prior derm closed. Warts on hands appeared few years ago - increasing in size, other small one forming.  Plans otc treatment first.    Immunization History  Administered Date(s) Administered   Tdap 09/23/2020  Flu vaccine - declines.  Covid vaccine - declines.   No results found.optho yearly  Dental:every 6 months  Alcohol: none  Tobacco: none  Exercise:minimal - some with up and down stairs.    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   Periocular dermatitis 05/07/2021   Seborrheic dermatitis 05/07/2021   ASCUS of cervix with negative high risk HPV 10/01/2020   Fibrocystic breast changes of both breasts 09/23/2020   Chronic allergic rhinitis 11/07/2018   Chronic cough 10/11/2017   Past Medical History:  Diagnosis Date   GERD (gastroesophageal reflux disease)    Periocular dermatitis 05/07/2021   [place], derm   Seborrheic dermatitis 05/07/2021   [place], derm   Past Surgical History:  Procedure Laterality Date   CESAREAN SECTION     No Known Allergies Prior to Admission medications   Medication Sig Start Date End Date Taking? Authorizing Provider  Ascorbic Acid (VITAMIN C) 250 MG CHEW Chew by mouth.  Yes [provider]  BLACK ELDERBERRY,BERRY-FLOWER, PO Take 1 capsule by mouth daily. Elder berry 500 mg , Vit C 400 mg  And 125 mcg Vit D3   Yes [provider]  Coenzyme Q10 (CO Q10) 100 MG CAPS Take 1 capsule by mouth daily.   Yes [provider]  MAGNESIUM GLYCINATE PO Take 2 capsules by mouth daily in the afternoon.   Yes [provider]  meloxicam (MOBIC) 15 MG tablet Take 1 tablet (15 mg total) by mouth daily as needed for pain. 11/19/22  Yes Rodolph Bong, MD  Multiple Vitamins-Minerals (ALIVE WOMENS ENERGY) TABS Take 1 tablet by mouth daily.   Yes [provider]  nitrofurantoin, macrocrystal-monohydrate, (MACROBID) 100 MG capsule Take 1 capsule (100 mg total) by mouth 2 (two) times daily. 08/31/22  Yes Shade Flood,  MD  Omega-3 Fatty Acids (FISH OIL PO) Take 1 capsule by mouth daily in the afternoon.   Yes [provider]  TURMERIC PO Take 1,000 mg by mouth daily.   Yes [provider]  phenazopyridine (PYRIDIUM) 100 MG tablet Take 1 tablet (100 mg total) by mouth 3 (three) times daily as needed for pain. Patient not taking: Reported on 03/22/2023 08/31/22   Shade Flood, MD  traMADol (ULTRAM) 50 MG tablet Take 1 tablet (50 mg total) by mouth every 8 (eight) hours as needed for severe pain. Patient not taking: Reported on 03/22/2023 11/19/22   Rodolph Bong, MD   Social History   Socioeconomic History   Marital status: Married    Spouse name: Not on file   Number of children: 2   Years of education: Not on file   Highest education level: Not on file  Occupational History   Occupation: Stay at Home mom; home schools  Tobacco Use   Smoking status: Former    Current packs/day: 0.00    Average packs/day: 0.3 packs/day for 2.0 years (0.5 ttl pk-yrs)    Types: Cigarettes    Start date: 03/1997    Quit date: 03/1999    Years since quitting: 24.0   Smokeless tobacco: Never  Vaping Use   Vaping status: Never Used  Substance and Sexual Activity   Alcohol use: Never   Drug use: Never   Sexual activity: Yes    Birth control/protection: None  Other Topics Concern   Not on file  Social History Narrative   Not on file   Social Determinants of Health   Financial Resource Strain: Not on file  Food Insecurity: Not on file  Transportation Needs: Not on file  Physical Activity: Not on file  Stress: Not on file  Social Connections: Not on file  Intimate Partner Violence: Not on file    Review of Systems  13 point review of systems per patient health survey noted.  Negative other than as indicated above or in HPI.   Objective:   Vitals:   03/22/23 1312  BP: 118/78  Pulse: 82  Temp: 98.1 F (36.7 C)  TempSrc: Oral  SpO2: 98%  Weight: 112 lb 6.4 oz (51 kg)  Height: 4'  10" (1.473 m)     Physical Exam Constitutional:      Appearance: She is well-developed.  HENT:     Head: Normocephalic and atraumatic.     Right Ear: External ear normal.     Left Ear: External ear normal.  Eyes:     Conjunctiva/sclera: Conjunctivae normal.     Pupils: Pupils are equal, round,  and reactive to light.  Neck:     Thyroid: No thyromegaly.  Cardiovascular:     Rate and Rhythm: Normal rate and regular rhythm.     Heart sounds: Normal heart sounds. No murmur heard. Pulmonary:     Effort: Pulmonary effort is normal. No respiratory distress.     Breath sounds: Normal breath sounds. No wheezing.  Abdominal:     General: Bowel sounds are normal.     Palpations: Abdomen is soft.     Tenderness: There is no abdominal tenderness.  Musculoskeletal:        General: No tenderness. Normal range of motion.     Cervical back: Normal range of motion and neck supple.  Lymphadenopathy:     Cervical: No cervical adenopathy.  Skin:    General: Skin is warm and dry.     Findings: No rash.     Comments: small verrucal lesion on end of finger.   Neurological:     Mental Status: She is alert and oriented to person, place, and time.  Psychiatric:        Behavior: Behavior normal.        Thought Content: Thought content normal.     Assessment & Plan:  Victoria Mcintosh is a 42 y.o. female . Annual physical exam  - -anticipatory guidance as below in AVS, screening labs above. Health maintenance items as above in HPI discussed/recommended as applicable.   - mammogram recommended.   - vaccines declined.   Screening for hyperlipidemia - Plan: Comprehensive metabolic panel, Lipid panel  Screening for diabetes mellitus - Plan: Hemoglobin A1c  Small common wart of finger - home treatment planned with option of rtc for liquid nitrogen cryodestruction.  No orders of the defined types were placed in this encounter.  Patient Instructions  Thanks for coming in today.  If any concerns on  your labs I will let you know.  Goal exercise 150 minutes/week.  I do recommend mammogram but leave that up to you.  See other information regarding health maintenance below.  Follow-up with me in 1 year for physical but let me know if there are any questions sooner.  Take care!  Preventive Care 29-24 Years Old, Female Preventive care refers to lifestyle choices and visits with your health care provider that can promote health and wellness. Preventive care visits are also called wellness exams. What can I expect for my preventive care visit? Counseling Your health care provider may ask you questions about your: Medical history, including: Past medical problems. Family medical history. Pregnancy history. Current health, including: Menstrual cycle. Method of birth control. Emotional well-being. Home life and relationship well-being. Sexual activity and sexual health. Lifestyle, including: Alcohol, nicotine or tobacco, and drug use. Access to firearms. Diet, exercise, and sleep habits. Work and work Astronomer. Sunscreen use. Safety issues such as seatbelt and bike helmet use. Physical exam Your health care provider will check your: Height and weight. These may be used to calculate your BMI (body mass index). BMI is a measurement that tells if you are at a healthy weight. Waist circumference. This measures the distance around your waistline. This measurement also tells if you are at a healthy weight and may help predict your risk of certain diseases, such as type 2 diabetes and high blood pressure. Heart rate and blood pressure. Body temperature. Skin for abnormal spots. What immunizations do I need?  Vaccines are usually given at various ages, according to a schedule. Your health care provider will recommend  vaccines for you based on your age, medical history, and lifestyle or other factors, such as travel or where you work. What tests do I need? Screening Your health care provider  may recommend screening tests for certain conditions. This may include: Lipid and cholesterol levels. Diabetes screening. This is done by checking your blood sugar (glucose) after you have not eaten for a while (fasting). Pelvic exam and Pap test. Hepatitis B test. Hepatitis C test. HIV (human immunodeficiency virus) test. STI (sexually transmitted infection) testing, if you are at risk. Lung cancer screening. Colorectal cancer screening. Mammogram. Talk with your health care provider about when you should start having regular mammograms. This may depend on whether you have a family history of breast cancer. BRCA-related cancer screening. This may be done if you have a family history of breast, ovarian, tubal, or peritoneal cancers. Bone density scan. This is done to screen for osteoporosis. Talk with your health care provider about your test results, treatment options, and if necessary, the need for more tests. Follow these instructions at home: Eating and drinking  Eat a diet that includes fresh fruits and vegetables, whole grains, lean protein, and low-fat dairy products. Take vitamin and mineral supplements as recommended by your health care provider. Do not drink alcohol if: Your health care provider tells you not to drink. You are pregnant, may be pregnant, or are planning to become pregnant. If you drink alcohol: Limit how much you have to 0-1 drink a day. Know how much alcohol is in your drink. In the [place] one drink equals one 12 oz bottle of beer (355 mL), one 5 oz glass of wine (148 mL), or one 1 oz glass of hard liquor (44 mL). Lifestyle Brush your teeth every morning and night with fluoride toothpaste. Floss one time each day. Exercise for at least 30 minutes 5 or more days each week. Do not use any products that contain nicotine or tobacco. These products include cigarettes, chewing tobacco, and vaping devices, such as e-cigarettes. If you need help quitting, ask your  health care provider. Do not use drugs. If you are sexually active, practice safe sex. Use a condom or other form of protection to prevent STIs. If you do not wish to become pregnant, use a form of birth control. If you plan to become pregnant, see your health care provider for a prepregnancy visit. Take aspirin only as told by your health care provider. Make sure that you understand how much to take and what form to take. Work with your health care provider to find out whether it is safe and beneficial for you to take aspirin daily. Find healthy ways to manage stress, such as: Meditation, yoga, or listening to music. Journaling. Talking to a trusted person. Spending time with friends and family. Minimize exposure to UV radiation to reduce your risk of skin cancer. Safety Always wear your seat belt while driving or riding in a vehicle. Do not drive: If you have been drinking alcohol. Do not ride with someone who has been drinking. When you are tired or distracted. While texting. If you have been using any mind-altering substances or drugs. Wear a helmet and other protective equipment during sports activities. If you have firearms in your house, make sure you follow all gun safety procedures. Seek help if you have been physically or sexually abused. What's next? Visit your health care provider once a year for an annual wellness visit. Ask your health care provider how often you should have your  eyes and teeth checked. Stay up to date on all vaccines. This information is not intended to replace advice given to you by your health care provider. Make sure you discuss any questions you have with your health care provider. Document Revised: 11/13/2020 Document Reviewed: 11/13/2020 Elsevier Patient Education  2024 [employer]     Signed,   Meredith Staggers, MD [facility], [facility] 03/22/23 1:52 PM

## 2023-03-23 LAB — COMPREHENSIVE METABOLIC PANEL
ALT: 14 U/L (ref 0–35)
AST: 21 U/L (ref 0–37)
Albumin: 4.5 g/dL (ref 3.5–5.2)
Alkaline Phosphatase: 35 U/L — ABNORMAL LOW (ref 39–117)
BUN: 11 mg/dL (ref 6–23)
CO2: 27 meq/L (ref 19–32)
Calcium: 9.6 mg/dL (ref 8.4–10.5)
Chloride: 101 meq/L (ref 96–112)
Creatinine, Ser: 0.75 mg/dL (ref 0.40–1.20)
GFR: 97.87 mL/min (ref >60.00)
Glucose, Bld: 72 mg/dL (ref 70–99)
Potassium: 4.5 meq/L (ref 3.5–5.1)
Sodium: 138 meq/L (ref 135–145)
Total Bilirubin: 0.5 mg/dL (ref 0.2–1.2)
Total Protein: 7.4 g/dL (ref 6.0–8.3)

## 2023-03-23 LAB — LIPID PANEL
Cholesterol: 187 mg/dL (ref 0–200)
HDL: 58.4 mg/dL (ref >39.00)
LDL Cholesterol: 112 mg/dL — ABNORMAL HIGH (ref 0–99)
NonHDL: 128.8
Total CHOL/HDL Ratio: 3
Triglycerides: 82 mg/dL (ref 0.0–149.0)
VLDL: 16.4 mg/dL (ref 0.0–40.0)

## 2023-03-23 LAB — HEMOGLOBIN A1C: Hgb A1c MFr Bld: 5.4 % (ref 4.6–6.5)

## 2023-07-28 ENCOUNTER — Telehealth: Payer: Self-pay

## 2023-07-28 NOTE — Telephone Encounter (Signed)
 Attempted a call to the patient to discuss need for fasting, typically would not need her to come in fasting for kidney evaluation if further questions would encourage connecting with Victoria Mcintosh or Victoria Mcintosh in office

## 2023-07-28 NOTE — Telephone Encounter (Signed)
 Copied from CRM [id number]. Topic: Clinical - Medical Advice >> Jul 28, 2023  2:04 PM Theodis Sato wrote: Reason for CRM: Patient has a appointment with Dr. Beverely Low on Friday 2/28 as she is worried about her kidney and is going to request the provider to rust test for her kidney. Patient would like to know if she can come in fasting. Please call mobile number to advise.

## 2023-07-29 NOTE — Telephone Encounter (Signed)
 Called patient and discuss this and notes she is wondering if she will be having blood work or if it is just a urine, patient has been advised that need for blood work will be determined at the time of the visit based on the concerns and sxs that are expressed, but no need to be fasting for those labs patient thanked me and will be here tomorrow

## 2023-07-30 ENCOUNTER — Encounter: Payer: Self-pay | Admitting: Family Medicine

## 2023-07-30 ENCOUNTER — Ambulatory Visit (INDEPENDENT_AMBULATORY_CARE_PROVIDER_SITE_OTHER): Payer: Managed Care, Other (non HMO) | Admitting: Family Medicine

## 2023-07-30 VITALS — BP 112/72 | HR 77 | Temp 98.4°F | Ht <= 58 in | Wt 112.0 lb

## 2023-07-30 DIAGNOSIS — R079 Chest pain, unspecified: Secondary | ICD-10-CM | POA: Diagnosis not present

## 2023-07-30 DIAGNOSIS — R8290 Unspecified abnormal findings in urine: Secondary | ICD-10-CM | POA: Diagnosis not present

## 2023-07-30 DIAGNOSIS — R350 Frequency of micturition: Secondary | ICD-10-CM

## 2023-07-30 LAB — POCT URINALYSIS DIPSTICK
Bilirubin, UA: NEGATIVE
Blood, UA: NEGATIVE
Glucose, UA: NEGATIVE
Ketones, UA: NEGATIVE
Leukocytes, UA: NEGATIVE
Nitrite, UA: NEGATIVE
Protein, UA: NEGATIVE
Spec Grav, UA: 1.015 (ref 1.010–1.025)
Urobilinogen, UA: 0.2 U/dL
pH, UA: 6 (ref 5.0–8.0)

## 2023-07-30 NOTE — Progress Notes (Signed)
   Subjective:    Patient ID: Victoria Mcintosh, female    DOB: July 29, 1980, 43 y.o.   MRN: [id number]  HPI CP- pt reports she was lying down last night and developed L sided CP.  Sxs lasted a few minutes last night, had sxs again this morning, and is having some right now.  Pt reports this feels similar to previous reflux.  No N/V.  No SOB.  No palpitations.  Pt doesn't take anything for reflux.  Is not interested in meds.  Urinary odor- pt reports urine odor is very potent x2-3 weeks.  No change in supplements, medications.  Did have gastroenteritis the week prior to sxs starting.  No change in fluid intake.  No change in diet.  No burning w/ urination.  No change in frequency.  Denies urgency.  Notes that if she doesn't void when she feels the urge, she will have some discomfort on L side of abd.   Review of Systems For ROS see HPI     Objective:   Physical Exam Vitals reviewed.  Constitutional:      General: She is not in acute distress.    Appearance: She is well-developed. She is not ill-appearing.  HENT:     Head: Normocephalic and atraumatic.  Eyes:     Conjunctiva/sclera: Conjunctivae normal.     Pupils: Pupils are equal, round, and reactive to light.  Neck:     Thyroid: No thyromegaly.  Cardiovascular:     Rate and Rhythm: Normal rate and regular rhythm.     Heart sounds: Normal heart sounds. No murmur heard. Pulmonary:     Effort: Pulmonary effort is normal. No respiratory distress.     Breath sounds: Normal breath sounds.  Abdominal:     General: There is no distension.     Palpations: Abdomen is soft.     Tenderness: There is abdominal tenderness (L pelvic TTP- pt is mid-cycle) in the left lower quadrant. There is no right CVA tenderness, left CVA tenderness, guarding or rebound.  Musculoskeletal:     Cervical back: Normal range of motion and neck supple.  Lymphadenopathy:     Cervical: No cervical adenopathy.  Skin:    General: Skin is warm and dry.  Neurological:      Mental Status: She is alert and oriented to person, place, and time.  Psychiatric:        Behavior: Behavior normal.           Assessment & Plan:  CP- new to provider, pt reports hx of similar that she attributed to reflux.  She had no associated SOB, N/V, dizziness.  EKG WNL.  She had recent gastroenteritis which may have precipitated this reflux.  She is not interested in medication.  Discussed lifestyle and dietary modifications.  Abnormal urine odor- new.  Pt's UA is normal.  Given her reported strong odor, will send for cx to ensure no infxn.  Will hold on abx tx until results available.  Pt expressed understanding and is in agreement w/ plan.

## 2023-07-30 NOTE — Patient Instructions (Signed)
 Follow up as needed or as scheduled We'll notify you of your urine results and make any changes if needed Drink LOTS of water to flush your system If you have another episode of chest pain, take some Tums and see if it improves Call with any questions or concerns Stay Safe!  Stay Healthy! Have a great weekend!

## 2023-07-31 LAB — URINE CULTURE
MICRO NUMBER:: 16143856
Result:: NO GROWTH
SPECIMEN QUALITY:: ADEQUATE

## 2023-08-02 ENCOUNTER — Telehealth: Payer: Self-pay

## 2023-08-02 ENCOUNTER — Encounter: Payer: Self-pay | Admitting: Family Medicine

## 2023-08-02 NOTE — Telephone Encounter (Signed)
 Pt has reviewed labs via MyChart

## 2023-08-02 NOTE — Telephone Encounter (Signed)
-----   Message from Neena Rhymes sent at 08/02/2023  7:41 AM EST ----- No evidence of UTI.  This is good news!

## 2023-10-07 ENCOUNTER — Ambulatory Visit (INDEPENDENT_AMBULATORY_CARE_PROVIDER_SITE_OTHER): Admitting: Family Medicine

## 2023-10-07 ENCOUNTER — Encounter: Payer: Self-pay | Admitting: Family Medicine

## 2023-10-07 ENCOUNTER — Ambulatory Visit

## 2023-10-07 VITALS — BP 108/60 | HR 76 | Temp 99.3°F | Ht <= 58 in | Wt 110.4 lb

## 2023-10-07 DIAGNOSIS — Z3490 Encounter for supervision of normal pregnancy, unspecified, unspecified trimester: Secondary | ICD-10-CM

## 2023-10-07 DIAGNOSIS — N926 Irregular menstruation, unspecified: Secondary | ICD-10-CM

## 2023-10-07 LAB — POCT URINE PREGNANCY: Preg Test, Ur: POSITIVE — AB

## 2023-10-07 MED ORDER — COMPLETENATE 29-1 MG PO CHEW: 1.0000 | CHEWABLE_TABLET | Freq: Every day | ORAL | 5 refills | Status: AC

## 2023-10-07 NOTE — Patient Instructions (Signed)
 Urine pregnancy test was positive in the office as well.  I will check the serum or blood test but again I believe this is a positive result.  I do recommend starting a prenatal vitamin once per day for now and would hold on other supplements until those are discussed with OB/GYN.  Please let me know if there are any questions prior to that visit.  Take care!

## 2023-10-07 NOTE — Progress Notes (Signed)
 Subjective:  Patient ID: Victoria Mcintosh, female    DOB: 05-19-1981  Age: 43 y.o. MRN: [id number]  CC:  Chief Complaint  Patient presents with   Late Period    08/30/2023 for last period, pt has had once positive test at home, pt does not note if this is planned or unplanned    HPI Victoria Mcintosh presents for   Delayed Menses: Last menses 08/30/23 usually monthly.  Positive pregnancy test at home yesterday.  Question of perimenopause past year, but some variable timing of menses past year.  Here for repeat testing.  Some fatigue, nausea, breast tenderness - past month. No vomiting.  Slight soreness on side, but no pelvic pain or bleeding.  No current GYN, but appt with OBGYN - called for appt this morning, appt with provider May 28th.  No prenatal vitamins.  2 children at home. 13yo son and 15yo dtr.  No contraception - not trying for pregnancy but ok with becoming pregnant.  No alcohol, tobacco.   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   Periocular dermatitis 05/07/2021   Seborrheic dermatitis 05/07/2021   ASCUS of cervix with negative high risk HPV 10/01/2020   Fibrocystic breast changes of both breasts 09/23/2020   Chronic allergic rhinitis 11/07/2018   Chronic cough 10/11/2017   Past Medical History:  Diagnosis Date   GERD (gastroesophageal reflux disease)    Periocular dermatitis 05/07/2021   [place], derm   Seborrheic dermatitis 05/07/2021   [place], derm   Past Surgical History:  Procedure Laterality Date   CESAREAN SECTION     No Known Allergies Prior to Admission medications   Medication Sig Start Date End Date Taking? Authorizing Provider  Ascorbic Acid (VITAMIN C) 250 MG CHEW Chew by mouth.   Yes [provider]  BLACK ELDERBERRY,BERRY-FLOWER, PO Take 1 capsule by mouth daily. Elder berry 500 mg , Vit C 400 mg  And 125 mcg Vit D3   Yes [provider]  Cholecalciferol (VITAMIN D -3) 125 MCG (5000 UT) TABS Take by mouth daily. With 100mcg of K2   Yes [provider]  Coenzyme Q10 (CO Q10) 100 MG CAPS Take 1 capsule by mouth daily.   Yes [provider]  Oksana Bergamo Oil (OMEGA-3) 500 MG CAPS Take 500 mg by mouth daily.   Yes [provider]  MAGNESIUM GLYCINATE PO Take 2 capsules by mouth daily in the afternoon.   Yes [provider]  Multiple Vitamins-Minerals (ALIVE WOMENS ENERGY) TABS Take 1 tablet by mouth daily.   Yes [provider]  TURMERIC PO Take 1,000 mg by mouth daily.   Yes [provider]  Zinc 50 MG TABS Take by mouth daily.   Yes [provider]   Social History   Socioeconomic History   Marital status: Married    Spouse name: Not on file   Number of children: 2   Years of education: Not on file   Highest education level: Not on file  Occupational History   Occupation: Stay at Home mom; home schools  Tobacco Use   Smoking status: Former    Current packs/day: 0.00    Average packs/day: 0.3 packs/day for 2.0 years (0.5 ttl pk-yrs)    Types: Cigarettes    Start date: 03/1997    Quit date: 03/1999    Years since quitting: 24.6   Smokeless tobacco: Never  Vaping Use  Vaping status: Never Used  Substance and Sexual Activity   Alcohol use: Never   Drug use: Never   Sexual activity: Yes    Birth control/protection: None  Other Topics Concern   Not on file  Social History Narrative   Not on file   Social Drivers of Health   Financial Resource Strain: Not on file  Food Insecurity: Not on file  Transportation Needs: Not on file  Physical Activity: Not on file  Stress: Not on file  Social Connections: Not on file  Intimate Partner Violence: Not on file    Review of Systems Per HPI  Objective:   Vitals:   10/07/23 1403  BP: 108/60  Pulse: 76  Temp: 99.3 F (37.4 C)  TempSrc: Temporal  SpO2: 99%  Weight: 110 lb 6.4 oz (50.1 kg)   Height: 4\' 10"  (1.473 m)     Physical Exam Vitals reviewed.  Constitutional:      Appearance: Normal appearance. She is well-developed.  HENT:     Head: Normocephalic and atraumatic.  Eyes:     Conjunctiva/sclera: Conjunctivae normal.     Pupils: Pupils are equal, round, and reactive to light.  Neck:     Vascular: No carotid bruit.  Cardiovascular:     Rate and Rhythm: Normal rate and regular rhythm.     Heart sounds: Normal heart sounds.  Pulmonary:     Effort: Pulmonary effort is normal.     Breath sounds: Normal breath sounds.  Abdominal:     Palpations: Abdomen is soft. There is no mass or pulsatile mass.     Tenderness: There is no abdominal tenderness. There is no guarding.  Musculoskeletal:     Right lower leg: No edema.     Left lower leg: No edema.  Skin:    General: Skin is warm and dry.  Neurological:     Mental Status: She is alert and oriented to person, place, and time.  Psychiatric:        Mood and Affect: Mood normal.        Behavior: Behavior normal.     Results for orders placed or performed in visit on 10/07/23  POCT urine pregnancy   Collection Time: 10/07/23  3:53 PM  Result Value Ref Range   Preg Test, Ur Positive (A) Negative       Assessment & Plan:  Victoria Mcintosh is a 43 y.o. female . Late menses - Plan: POCT urine pregnancy, prenatal vitamin w/FE, FA (NATACHEW) 29-1 MG CHEW chewable tablet, CBC, B-HCG Quant, CANCELED: POCT Pregnancy, Urine  Pregnancy, unspecified gestational age - Plan: prenatal vitamin w/FE, FA (NATACHEW) 29-1 MG CHEW chewable tablet, CBC, B-HCG Quant Late menses with positive home pregnancy test and in office urine pregnancy testing.  Discussed my concerns with possible false positive testing but suspect this is a true pregnancy, 5 weeks 3 days by LMP.  Will check serum hCG.  Start prenatal vitamins, stop other supplements at this time and she does have a follow-up with GYN in the next few weeks.  RTC/ER precautions  given.  All questions were answered.  Meds ordered this encounter  Medications   prenatal vitamin w/FE, FA (NATACHEW) 29-1 MG CHEW chewable tablet    Sig: Chew 1 tablet by mouth daily at 12 noon.    Dispense:  30 tablet    Refill:  5    Pnv of choice - please dispense one with DHA if possible.   Patient Instructions  Urine pregnancy test  was positive in the office as well.  I will check the serum or blood test but again I believe this is a positive result.  I do recommend starting a prenatal vitamin once per day for now and would hold on other supplements until those are discussed with OB/GYN.  Please let me know if there are any questions prior to that visit.  Take care!     Signed,   Caro Christmas, MD [facility], [facility] 10/07/23 3:02 PM

## 2023-10-08 ENCOUNTER — Telehealth: Payer: Self-pay

## 2023-10-08 LAB — CBC
HCT: 40 % (ref 36.0–46.0)
Hemoglobin: 13.8 g/dL (ref 12.0–15.0)
MCHC: 34.5 g/dL (ref 30.0–36.0)
MCV: 91.6 fl (ref 78.0–100.0)
Platelets: 353 10*3/uL (ref 150.0–400.0)
RBC: 4.37 Mil/uL (ref 3.87–5.11)
RDW: 12.7 % (ref 11.5–15.5)
WBC: 6.5 10*3/uL (ref 4.0–10.5)

## 2023-10-08 LAB — HCG, QUANTITATIVE, PREGNANCY: Quantitative HCG: 54469 m[IU]/mL

## 2023-10-08 NOTE — Telephone Encounter (Addendum)
 Called with results this afternoon. Discussed level and based on weeks by LMP, EGA and pregnancy (possibility of multiples - twins, triplets in spouse's family) will need to be clarified by ultrasound.  She noted that confirmation ultrasound is planned at her OBGYN visit. Keep follow up with OB/GYN as planned, with rtc/er precautions given.

## 2023-10-08 NOTE — Telephone Encounter (Signed)
 Copied from CRM [id number]. Topic: Clinical - Lab/Test Results >> Oct 08, 2023  2:49 PM Jenice Mitts wrote: Reason for CRM: Patient is calling in because she would like to go over her latest test results

## 2023-10-08 NOTE — Telephone Encounter (Signed)
 Patient is asking for lab results, please advise when you are able to

## 2023-11-15 LAB — HEPATITIS C ANTIBODY: HCV Ab: NEGATIVE

## 2023-11-15 LAB — OB RESULTS CONSOLE HEPATITIS B SURFACE ANTIGEN: Hepatitis B Surface Ag: NEGATIVE

## 2023-11-15 LAB — OB RESULTS CONSOLE HIV ANTIBODY (ROUTINE TESTING): HIV: NONREACTIVE

## 2023-11-15 LAB — OB RESULTS CONSOLE ANTIBODY SCREEN: Antibody Screen: NEGATIVE

## 2023-11-15 LAB — OB RESULTS CONSOLE RUBELLA ANTIBODY, IGM: Rubella: IMMUNE

## 2023-11-29 LAB — OB RESULTS CONSOLE GC/CHLAMYDIA
Chlamydia: NEGATIVE
Neisseria Gonorrhea: NEGATIVE

## 2024-03-23 ENCOUNTER — Encounter: Payer: Managed Care, Other (non HMO) | Admitting: Family Medicine

## 2024-05-09 LAB — OB RESULTS CONSOLE GBS: GBS: NEGATIVE

## 2024-05-11 ENCOUNTER — Encounter (HOSPITAL_COMMUNITY): Payer: Self-pay | Admitting: *Deleted

## 2024-05-11 NOTE — Patient Instructions (Signed)
 Victoria Mcintosh  05/11/2024   Your procedure is scheduled on:  05/29/2024  Arrive at 0800 at Entrance C on [address] at [facility]  and [address]. You are invited to use the FREE valet parking or use the Visitor's parking deck.  Pick up the phone at the desk and dial [phone].  Call this number if you have problems the morning of surgery: [phone]  Remember:   Do not eat food:(After Midnight) Desps de medianoche.  You may drink clear liquids until  __0600___.  Clear liquids means a liquid you can see thru.  It can have color such as Cola or Kool aid.  Tea is OK and coffee as long as no milk or creamer of any kind.  Take these medicines the morning of surgery with A SIP OF WATER:  none   Do not wear jewelry, make-up or nail polish.  Do not wear lotions, powders, or perfumes. Do not wear deodorant.  Do not shave 48 hours prior to surgery.  Do not bring valuables to the hospital.  [facility] is not   responsible for any belongings or valuables brought to the hospital.  Contacts, dentures or bridgework may not be worn into surgery.  Leave suitcase in the car. After surgery it may be brought to your room.  For patients admitted to the hospital, checkout time is 11:00 AM the day of              discharge.      Please read over the following fact sheets that you were given:     Preparing for Surgery

## 2024-05-12 ENCOUNTER — Telehealth (HOSPITAL_COMMUNITY): Payer: Self-pay | Admitting: *Deleted

## 2024-05-12 NOTE — Telephone Encounter (Signed)
 Preadmission screen

## 2024-05-15 ENCOUNTER — Telehealth (HOSPITAL_COMMUNITY): Payer: Self-pay | Admitting: *Deleted

## 2024-05-15 NOTE — Telephone Encounter (Signed)
 Preadmission screen

## 2024-05-17 ENCOUNTER — Encounter (HOSPITAL_COMMUNITY): Payer: Self-pay

## 2024-05-26 ENCOUNTER — Encounter (HOSPITAL_COMMUNITY)
Admission: RE | Admit: 2024-05-26 | Discharge: 2024-05-26 | Disposition: A | Discharge status: Home / Self Care | Source: Ambulatory Visit | Attending: Obstetrics and Gynecology | Admitting: Obstetrics and Gynecology

## 2024-05-26 DIAGNOSIS — Z01812 Encounter for preprocedural laboratory examination: Secondary | ICD-10-CM | POA: Insufficient documentation

## 2024-05-26 HISTORY — DX: Genetic susceptibility to other disease: Z15.89

## 2024-05-26 LAB — CBC
HCT: 39.8 % (ref 36.0–46.0)
Hemoglobin: 13.9 g/dL (ref 12.0–15.0)
MCH: 31.7 pg (ref 26.0–34.0)
MCHC: 34.9 g/dL (ref 30.0–36.0)
MCV: 90.7 fL (ref 80.0–100.0)
Platelets: 321 K/uL (ref 150–400)
RBC: 4.39 MIL/uL (ref 3.87–5.11)
RDW: 12.4 % (ref 11.5–15.5)
WBC: 7.7 K/uL (ref 4.0–10.5)
nRBC: 0 % (ref 0.0–0.2)

## 2024-05-26 LAB — TYPE AND SCREEN
ABO/RH(D): B POS
Antibody Screen: NEGATIVE

## 2024-05-26 LAB — SYPHILIS: RPR W/REFLEX TO RPR TITER AND TREPONEMAL ANTIBODIES, TRADITIONAL SCREENING AND DIAGNOSIS ALGORITHM: RPR Ser Ql: NONREACTIVE

## 2024-05-28 NOTE — Anesthesia Preprocedure Evaluation (Signed)
"                                    Anesthesia Evaluation  Patient identified by MRN, date of birth, ID band Patient awake    Reviewed: Allergy & Precautions, H&P , NPO status , Patient's Chart, lab work & pertinent test results, reviewed documented beta blocker date and time   Airway Mallampati: I  TM Distance: >3 FB Neck ROM: Full    Dental no notable dental hx. (+) Teeth Intact, Dental Advisory Given   Pulmonary neg pulmonary ROS, former smoker   Pulmonary exam normal breath sounds clear to auscultation       Cardiovascular negative cardio ROS Normal cardiovascular exam Rhythm:Regular Rate:Normal     Neuro/Psych negative neurological ROS  negative psych ROS   GI/Hepatic negative GI ROS, Neg liver ROS,GERD  ,,  Endo/Other  negative endocrine ROS    Renal/GU negative Renal ROS  negative genitourinary   Musculoskeletal negative musculoskeletal ROS (+) Arthritis , Rheumatoid disorders,    Abdominal   Peds negative pediatric ROS (+)  Hematology negative hematology ROS (+)   Anesthesia Other Findings   Reproductive/Obstetrics negative OB ROS (+) Pregnancy                              Anesthesia Physical Anesthesia Plan  ASA: 2  Anesthesia Plan: Spinal   Post-op Pain Management: Minimal or no pain anticipated   Induction: Intravenous  PONV Risk Score and Plan: 2  Airway Management Planned: Natural Airway and Simple Face Mask  Additional Equipment: None  Intra-op Plan:   Post-operative Plan:   Informed Consent: I have reviewed the patients History and Physical, chart, labs and discussed the procedure including the risks, benefits and alternatives for the proposed anesthesia with the patient or authorized representative who has indicated his/her understanding and acceptance.       Plan Discussed with: Anesthesiologist  Anesthesia Plan Comments: (Extremely petit patient 4'10'.  Will reduce fentanyl  to 10  micrograms and Duramorph  to 100 micrograms marcaine  to 10 milligrams. SABRA )         Anesthesia Quick Evaluation  "

## 2024-05-29 ENCOUNTER — Inpatient Hospital Stay (HOSPITAL_COMMUNITY)
Admission: RE | Admit: 2024-05-29 | Discharge: 2024-05-31 | DRG: 788 | Disposition: A | Discharge status: Home / Self Care | Attending: Obstetrics and Gynecology | Admitting: Obstetrics and Gynecology

## 2024-05-29 ENCOUNTER — Inpatient Hospital Stay (HOSPITAL_COMMUNITY): Payer: Self-pay | Admitting: Anesthesiology

## 2024-05-29 ENCOUNTER — Encounter (HOSPITAL_COMMUNITY): Payer: Self-pay | Admitting: Obstetrics and Gynecology

## 2024-05-29 ENCOUNTER — Encounter (HOSPITAL_COMMUNITY)
Admission: RE | Disposition: A | Discharge status: Home / Self Care | Payer: Self-pay | Source: Home / Self Care | Attending: Obstetrics and Gynecology

## 2024-05-29 ENCOUNTER — Other Ambulatory Visit: Payer: Self-pay

## 2024-05-29 DIAGNOSIS — Z833 Family history of diabetes mellitus: Secondary | ICD-10-CM

## 2024-05-29 DIAGNOSIS — Z87891 Personal history of nicotine dependence: Secondary | ICD-10-CM

## 2024-05-29 DIAGNOSIS — K219 Gastro-esophageal reflux disease without esophagitis: Secondary | ICD-10-CM | POA: Diagnosis present

## 2024-05-29 DIAGNOSIS — Z3A39 39 weeks gestation of pregnancy: Secondary | ICD-10-CM | POA: Diagnosis not present

## 2024-05-29 DIAGNOSIS — Z8249 Family history of ischemic heart disease and other diseases of the circulatory system: Secondary | ICD-10-CM

## 2024-05-29 DIAGNOSIS — O9962 Diseases of the digestive system complicating childbirth: Secondary | ICD-10-CM | POA: Diagnosis present

## 2024-05-29 DIAGNOSIS — O34211 Maternal care for low transverse scar from previous cesarean delivery: Secondary | ICD-10-CM | POA: Diagnosis present

## 2024-05-29 DIAGNOSIS — Z98891 History of uterine scar from previous surgery: Principal | ICD-10-CM

## 2024-05-29 MED ORDER — COCONUT OIL OIL
1.0000 | TOPICAL_OIL | Status: DC | PRN
  Administered 2024-05-31: 1 via TOPICAL

## 2024-05-29 MED ORDER — ONDANSETRON HCL 4 MG/2ML IJ SOLN
INTRAMUSCULAR | Status: DC | PRN
  Administered 2024-05-29: 4 mg via INTRAVENOUS

## 2024-05-29 MED ORDER — STERILE WATER FOR IRRIGATION IR SOLN
Status: DC | PRN
  Administered 2024-05-29: 1

## 2024-05-29 MED ORDER — OXYTOCIN-SODIUM CHLORIDE 30-0.9 UT/500ML-% IV SOLN
INTRAVENOUS | Status: AC
  Filled 2024-05-29: qty 500

## 2024-05-29 MED ORDER — IBUPROFEN 600 MG PO TABS
600.0000 mg | ORAL_TABLET | Freq: Four times a day (QID) | ORAL | Status: DC
  Administered 2024-05-30 – 2024-05-31 (×4): 600 mg via ORAL
  Filled 2024-05-29 (×4): qty 1

## 2024-05-29 MED ORDER — MORPHINE SULFATE (PF) 0.5 MG/ML IJ SOLN
INTRAMUSCULAR | Status: AC
  Filled 2024-05-29: qty 10

## 2024-05-29 MED ORDER — SENNOSIDES-DOCUSATE SODIUM 8.6-50 MG PO TABS
2.0000 | ORAL_TABLET | Freq: Every day | ORAL | Status: DC
  Administered 2024-05-30: 2 via ORAL
  Filled 2024-05-29 (×2): qty 2

## 2024-05-29 MED ORDER — ACETAMINOPHEN 10 MG/ML IV SOLN
INTRAVENOUS | Status: DC | PRN
  Administered 2024-05-29: 1000 mg via INTRAVENOUS

## 2024-05-29 MED ORDER — LACTATED RINGERS IV SOLN: INTRAVENOUS | Status: DC

## 2024-05-29 MED ORDER — OXYCODONE HCL 5 MG PO TABS
5.0000 mg | ORAL_TABLET | ORAL | Status: DC | PRN
  Administered 2024-05-30: 5 mg via ORAL
  Filled 2024-05-29: qty 1

## 2024-05-29 MED ORDER — ZOLPIDEM TARTRATE 5 MG PO TABS: 5.0000 mg | ORAL_TABLET | Freq: Every evening | ORAL | Status: DC | PRN

## 2024-05-29 MED ORDER — SOD CITRATE-CITRIC ACID 500-334 MG/5ML PO SOLN
30.0000 mL | ORAL | Status: AC
  Administered 2024-05-29: 30 mL via ORAL

## 2024-05-29 MED ORDER — MENTHOL 3 MG MT LOZG: 1.0000 | LOZENGE | OROMUCOSAL | Status: DC | PRN

## 2024-05-29 MED ORDER — ONDANSETRON HCL 4 MG/2ML IJ SOLN
INTRAMUSCULAR | Status: AC
  Filled 2024-05-29: qty 2

## 2024-05-29 MED ORDER — FAMOTIDINE 20 MG PO TABS: 20.0000 mg | ORAL_TABLET | Freq: Every evening | ORAL | Status: DC | PRN

## 2024-05-29 MED ORDER — SIMETHICONE 80 MG PO CHEW
80.0000 mg | CHEWABLE_TABLET | Freq: Three times a day (TID) | ORAL | Status: DC
  Administered 2024-05-29 – 2024-05-31 (×3): 80 mg via ORAL
  Filled 2024-05-29 (×5): qty 1

## 2024-05-29 MED ORDER — ACETAMINOPHEN 10 MG/ML IV SOLN
INTRAVENOUS | Status: AC
  Filled 2024-05-29: qty 100

## 2024-05-29 MED ORDER — OXYTOCIN-SODIUM CHLORIDE 30-0.9 UT/500ML-% IV SOLN: 2.5000 [IU]/h | INTRAVENOUS | Status: AC

## 2024-05-29 MED ORDER — OXYCODONE HCL 5 MG PO TABS: 5.0000 mg | ORAL_TABLET | Freq: Once | ORAL | Status: DC | PRN

## 2024-05-29 MED ORDER — PHENYLEPHRINE HCL-NACL 20-0.9 MG/250ML-% IV SOLN
INTRAVENOUS | Status: AC
  Filled 2024-05-29: qty 250

## 2024-05-29 MED ORDER — PRENATAL MULTIVITAMIN CH
1.0000 | ORAL_TABLET | Freq: Every day | ORAL | Status: DC
  Filled 2024-05-29 (×2): qty 1

## 2024-05-29 MED ORDER — FENTANYL CITRATE (PF) 100 MCG/2ML IJ SOLN
INTRAMUSCULAR | Status: AC
  Filled 2024-05-29: qty 2

## 2024-05-29 MED ORDER — PHENYLEPHRINE HCL-NACL 20-0.9 MG/250ML-% IV SOLN
INTRAVENOUS | Status: DC | PRN
  Administered 2024-05-29: 60 ug/min via INTRAVENOUS

## 2024-05-29 MED ORDER — FENTANYL CITRATE (PF) 100 MCG/2ML IJ SOLN: 25.0000 ug | INTRAMUSCULAR | Status: DC | PRN

## 2024-05-29 MED ORDER — WITCH HAZEL-GLYCERIN EX PADS: 1.0000 | MEDICATED_PAD | CUTANEOUS | Status: DC | PRN

## 2024-05-29 MED ORDER — FENTANYL CITRATE (PF) 100 MCG/2ML IJ SOLN
INTRAMUSCULAR | Status: DC | PRN
  Administered 2024-05-29: 10 ug via INTRATHECAL

## 2024-05-29 MED ORDER — SODIUM CHLORIDE 0.9 % IR SOLN
Status: DC | PRN
  Administered 2024-05-29: 1

## 2024-05-29 MED ORDER — DEXAMETHASONE SOD PHOSPHATE PF 10 MG/ML IJ SOLN
INTRAMUSCULAR | Status: DC | PRN
  Administered 2024-05-29 (×2): 5 mg via INTRAVENOUS

## 2024-05-29 MED ORDER — DIBUCAINE (PERIANAL) 1 % EX OINT: 1.0000 | TOPICAL_OINTMENT | CUTANEOUS | Status: DC | PRN

## 2024-05-29 MED ORDER — MEPERIDINE HCL 25 MG/ML IJ SOLN: 6.2500 mg | INTRAMUSCULAR | Status: DC | PRN

## 2024-05-29 MED ORDER — CEFAZOLIN SODIUM-DEXTROSE 2-4 GM/100ML-% IV SOLN
2.0000 g | INTRAVENOUS | Status: AC
  Administered 2024-05-29: 2 g via INTRAVENOUS

## 2024-05-29 MED ORDER — SIMETHICONE 80 MG PO CHEW
80.0000 mg | CHEWABLE_TABLET | ORAL | Status: DC | PRN
  Administered 2024-05-31: 80 mg via ORAL
  Filled 2024-05-29: qty 1

## 2024-05-29 MED ORDER — SOD CITRATE-CITRIC ACID 500-334 MG/5ML PO SOLN
ORAL | Status: AC
  Filled 2024-05-29: qty 30

## 2024-05-29 MED ORDER — BUPIVACAINE IN DEXTROSE 0.75-8.25 % IT SOLN
INTRATHECAL | Status: DC | PRN
  Administered 2024-05-29: 1.2 mL via INTRATHECAL

## 2024-05-29 MED ORDER — OXYTOCIN-SODIUM CHLORIDE 30-0.9 UT/500ML-% IV SOLN
INTRAVENOUS | Status: DC | PRN
  Administered 2024-05-29: 999 mL/h via INTRAVENOUS

## 2024-05-29 MED ORDER — DIPHENHYDRAMINE HCL 25 MG PO CAPS: 25.0000 mg | ORAL_CAPSULE | Freq: Four times a day (QID) | ORAL | Status: DC | PRN

## 2024-05-29 MED ORDER — CEFAZOLIN SODIUM-DEXTROSE 2-4 GM/100ML-% IV SOLN
INTRAVENOUS | Status: AC
  Filled 2024-05-29: qty 100

## 2024-05-29 MED ORDER — MORPHINE SULFATE (PF) 0.5 MG/ML IJ SOLN
INTRAMUSCULAR | Status: DC | PRN
  Administered 2024-05-29: 100 ug via INTRATHECAL

## 2024-05-29 MED ORDER — ONDANSETRON HCL 4 MG/2ML IJ SOLN: 4.0000 mg | Freq: Once | INTRAMUSCULAR | Status: DC | PRN

## 2024-05-29 MED ORDER — KETOROLAC TROMETHAMINE 30 MG/ML IJ SOLN
30.0000 mg | Freq: Four times a day (QID) | INTRAMUSCULAR | Status: AC
  Administered 2024-05-29 – 2024-05-30 (×4): 30 mg via INTRAVENOUS
  Filled 2024-05-29 (×4): qty 1

## 2024-05-29 MED ORDER — OXYCODONE HCL 5 MG/5ML PO SOLN: 5.0000 mg | Freq: Once | ORAL | Status: DC | PRN

## 2024-05-29 MED ORDER — ACETAMINOPHEN 500 MG PO TABS
1000.0000 mg | ORAL_TABLET | Freq: Four times a day (QID) | ORAL | Status: DC
  Administered 2024-05-29 – 2024-05-31 (×7): 1000 mg via ORAL
  Filled 2024-05-29 (×9): qty 2

## 2024-05-29 NOTE — Op Note (Signed)
 PROCEDURE DATE: 05/29/2024   PREOPERATIVE DIAGNOSIS: [redacted]w[redacted]d gestation of pregnancy, prior cesarean section   POSTOPERATIVE DIAGNOSIS: The same   PROCEDURE:  Repeat Low Transverse Cesarean Section   SURGEON:  Dr. Slater Door   INDICATIONS: This is a 55bn [id number] at 64 wga requiring cesarean section secondary to prior cesarean section.   Decision made to proceed with LTCS. The risks of cesarean section discussed with the patient included but were not limited to: bleeding which may require transfusion or reoperation; infection which may require antibiotics; injury to bowel, bladder, ureters or other surrounding organs; injury to the fetus; need for additional procedures including hysterectomy in the event of a life-threatening hemorrhage; placental abnormalities wth subsequent pregnancies, incisional problems, thromboembolic phenomenon and other postoperative/anesthesia complications. The patient agreed with the proposed plan, giving informed consent for the procedure.     FINDINGS:  Viable female infant in vertex presentation, APGARs 9+9,  Weight 3240g, Amniotic fluid clear,  Intact placenta, three vessel cord.  Grossly normal uterus. Bladder adherent to lower uterine segment - filmy adhesions. Otherwise no significant intraabdominal adhesions. .   ANESTHESIA:  Spinal ESTIMATED BLOOD LOSS: 246ccs SPECIMENS: Placenta for routine COMPLICATIONS: None immediate   PROCEDURE IN DETAIL:  The patient received intravenous antibiotics (2g Ancef ) and had sequential compression devices applied to her lower extremities while in the preoperative area.  She was then taken to the operating room where spinal anesthesia obtained. She was then placed in a dorsal supine position with a leftward tilt, and prepped and draped in a sterile manner.  A foley catheter was placed into her bladder and attached to constant gravity.  After an adequate timeout was performed, a Pfannenstiel skin incision was made with scalpel and  carried through to the underlying layer of fascia. The fascia was incised in the midline and this incision was extended bilaterally bluntly. Kocher clamps were applied to the superior aspect of the fascial incision and the underlying rectus muscles were dissected off bluntly and with the Bovie. A similar process was carried out on the inferior aspect of the facial incision. The rectus muscles were separated in the midline bluntly and the peritoneum was entered bluntly.  The peritoneum was extended bilaterally and an Alexis retractor was placed for better visualization. A bladder flap was created sharply and developed bluntly. A transverse hysterotomy was made with a scalpel and extended bilaterally bluntly. The infant was successfully delivered, and cord was clamped and cut and infant was handed over to awaiting neonatology team. Cord blood was collected. Uterine massage was then administered and the placenta delivered intact with three-vessel cord. The uterus was cleared of clot and debris.  The hysterotomy was closed with 0 vicryl.  The peritoneal cavity was inspected closely and excellent hemostasis was noted. The Alexis retractor was removed. The fascia was closed with 0-PDS in a running fashion with good restoration of anatomy.  The subcutaneus tissue was irrigated and was reapproximated using 2-0 monocryl.  The skin was closed with 4-0 Vicryl in a subcuticular fashion.  All surgical sites examined and hemostatic at end of procedure.   Pt tolerated the procedure well. All sponge/lap/needle counts were correct  X 2. Pt taken to recovery room in stable condition.     Slater Door, MD

## 2024-05-29 NOTE — Transfer of Care (Signed)
 Immediate Anesthesia Transfer of Care Note  Patient: Victoria Mcintosh  Procedure(s) Performed: CESAREAN DELIVERY (Abdomen)  Patient Location: PACU  Anesthesia Type:Spinal  Level of Consciousness: awake  Airway & Oxygen Therapy: Patient Spontanous Breathing  Post-op Assessment: Report given to RN and Post -op Vital signs reviewed and stable  Post vital signs: Reviewed and stable  Last Vitals:  Vitals Value Taken Time  BP 115/74 05/29/24 11:31  Temp    Pulse 63 05/29/24 11:32  Resp 15 05/29/24 11:32  SpO2 99 % 05/29/24 11:32  Vitals shown include unfiled device data.  Last Pain:  Vitals:   05/29/24 0848  TempSrc: Oral         Complications: No notable events documented.

## 2024-05-29 NOTE — Lactation Note (Signed)
 This note was copied from a baby's chart. Lactation Consultation Note  Patient Name: Victoria Mcintosh Date: 05/29/2024 Age:44 hours Reason for consult: Initial assessment;Term;RN request;Breastfeeding assistance  P3- RN requested latching assistance despite MOB declining lactation assistance. MOB was receptive to [facility] assisting this one time. Infant was placed on the left breast in the cross cradle hold. Infant was actively trying to search for the breast, but MOB needed assistance with supporting infant. LC was able to latch infant, but he sucked a few times and started crying. MOB did not want further assistance with latching infant. MOB placed infant STS on her chest. MOB asked how she can keep infant from sleeping now so he can sleep throughout the night. LC reviewed how infant should eat every 3 hrs. LC reviewed the first 24 hr birthday nap and the day 2 cluster feeding. When [facility] reviewed cluster feeding and how often infant may be awake, MOB got upset about the idea of infant possibly being awake all night on day 2. LC reviewed why this happens. LC encouraged MOB to call for further assistance as needed. MOB requested for RNs, MDs and techs to only come in as needed at night, preferably every 4 hrs only. LC informed MOB's RN of her request.  Maternal Data Has patient been taught Hand Expression?: No Does the patient have breastfeeding experience prior to this delivery?: Yes How long did the patient breastfeed?: 1 year with first child and 14 months with second child  Feeding Mother's Current Feeding Choice: Breast Milk  LATCH Score Latch: Repeated attempts needed to sustain latch, nipple held in mouth throughout feeding, stimulation needed to elicit sucking reflex.  Audible Swallowing: None  Type of Nipple: Everted at rest and after stimulation  Comfort (Breast/Nipple): Soft / non-tender  Hold (Positioning): Full assist, staff holds infant at breast  LATCH Score: 5   Lactation  Tools Discussed/Used Pump Education: Milk Storage  Interventions Interventions: Breast feeding basics reviewed;Assisted with latch;Breast compression;Adjust position;Support pillows;Position options;Skin to skin;Education;LC Services brochure  Discharge Discharge Education: Engorgement and breast care;Warning signs for feeding baby Pump: DEBP;Personal;Advised to call insurance company ([employer] )  Consult Status Consult Status: Complete (mother declined follow up) Date: 05/29/24    Recardo Hoit BS, IBCLC 05/29/2024, 5:12 PM

## 2024-05-29 NOTE — H&P (Addendum)
 Victoria Mcintosh is a 43 y.o. female presenting for scheduled repeat cesarean section. History of Csx1 and SVDx1. AMA, low risk genetic screening.  OB History     Gravida  3   Para  2   Term  2   Preterm      AB      Living  2      SAB      IAB      Ectopic      Multiple      Live Births  2          Past Medical History:  Diagnosis Date   GERD (gastroesophageal reflux disease)    HLA B27 (HLA B27 positive)    Periocular dermatitis 05/07/2021   [place], derm   Seborrheic dermatitis 05/07/2021   [place], derm   Past Surgical History:  Procedure Laterality Date   CESAREAN SECTION     WISDOM TOOTH EXTRACTION     Family History: family history includes Arthritis in her maternal grandmother; Cancer in her paternal grandfather; Diabetes in her maternal grandmother; Healthy in her daughter; Heart attack in her father; Heart disease in her father; Hyperlipidemia in her father and mother; Hypertension in her father and mother; Learning disabilities in her son; Miscarriages / Stillbirths in her mother. Social History:  reports that she quit smoking about 25 years ago. Her smoking use included cigarettes. She started smoking about 27 years ago. She has a 0.5 pack-year smoking history. She has never used smokeless tobacco. She reports that she does not drink alcohol and does not use drugs.     Maternal Diabetes: No Genetic Screening: Normal Maternal Ultrasounds/Referrals: Normal Fetal Ultrasounds or other Referrals:  None Maternal Substance Abuse:  No Significant Maternal Medications:  None Significant Maternal Lab Results:  Group B Strep negative Number of Prenatal Visits:greater than 3 verified prenatal visits     05/29/2024    8:49 AM 05/29/2024    8:48 AM 05/17/2024    2:31 PM  Vitals with BMI  Height 4' 10  4' 11  Weight 129 lbs 2 oz  132 lbs  BMI 26.99  26.65  Systolic  148   Diastolic  91   Pulse  68     Blood pressure (!) 148/91, pulse 68,  temperature 97.9 F (36.6 C), temperature source Oral, resp. rate 18, height 4' 10 (1.473 m), weight 58.6 kg, SpO2 94%.  Constitutional:      Appearance: Normal appearance.  HENT:     Head: Normocephalic.  Cardiovascular:     Rate and Rhythm: Normal rate    Pulses: Normal pulses.  Abdominal:     General: Abdomen is Gravid, nontender Neurological:     Mental Status: She is alert.   Prenatal labs: ABO, Rh: --/--/B POS (12/26 1107) Antibody: NEG (12/26 1107) Rubella: Immune (06/16 0000) RPR: NON REACTIVE (12/26 1104)  HBsAg: Negative (06/16 0000)  HIV: Non-reactive (06/16 0000)  GBS:   negative  Assessment/Plan: 43yo G3P2002 at [redacted]w[redacted]d who presents for scheduled cesarean section. Risks discussed including infection, bleeding, damage to surrounding structures, the need for additional procedures including hysterectomy, and the possibility of uterine rupture with neonatal morbidity/mortality, scarring, and abnormal placentation with subsequent pregnancies. Patient agrees to proceed.     Slater JINNY Door 05/29/2024, 9:07 AM

## 2024-05-29 NOTE — Lactation Note (Signed)
 This note was copied from a baby's chart. Lactation Consultation Note  Patient Name: Victoria Mcintosh Unijb'd Date: 05/29/2024 Age:44 hours  P3, repeat C/S, [redacted]w[redacted]d, AMA  Mother indicated on admission that she wants to breast and declines need for lactation services. LC can be notified at any time if mother would like to be seen by lactation.    Consult Status Consult Status: Complete (mother declined follow up) Date: 05/29/24    Joshua Rojelio HERO 05/29/2024, 3:09 PM

## 2024-05-29 NOTE — Anesthesia Postprocedure Evaluation (Signed)
"   Anesthesia Post Note  Patient: Victoria Mcintosh  Procedure(s) Performed: CESAREAN DELIVERY (Abdomen)     Patient location during evaluation: PACU Anesthesia Type: Spinal Level of consciousness: oriented and awake and alert Pain management: pain level controlled Vital Signs Assessment: post-procedure vital signs reviewed and stable Respiratory status: spontaneous breathing, respiratory function stable and patient connected to nasal cannula oxygen Cardiovascular status: blood pressure returned to baseline and stable Postop Assessment: no headache, no backache and no apparent nausea or vomiting Anesthetic complications: no   No notable events documented.  Last Vitals:  Vitals:   05/29/24 1238 05/29/24 1408  BP: 128/81 135/84  Pulse: (!) 54 (!) 55  Resp:  18  Temp: (!) 36.3 C 36.4 C  SpO2: 99% 100%    Last Pain:  Vitals:   05/29/24 1408  TempSrc: Oral   Pain Goal:                   [name]      "

## 2024-05-29 NOTE — Anesthesia Procedure Notes (Signed)
 Spinal  Patient location during procedure: OR Start time: 05/29/2024 10:20 AM End time: 05/29/2024 10:25 AM Reason for block: surgical anesthesia  Staffing Authorized by: Mallory Manus, MD   Performed by: Mallory Manus, MD  Preanesthetic Checklist Completed: patient identified, IV checked, site marked, risks and benefits discussed, surgical consent, monitors and equipment checked, pre-op evaluation and timeout performed Spinal Block Patient position: sitting Prep: DuraPrep Patient monitoring: heart rate, cardiac monitor, continuous pulse ox and blood pressure Approach: midline Location: L5-S1 Injection technique: single-shot Needle Needle type: Sprotte  Needle gauge: 24 G Needle length: 9 cm Assessment Sensory level: T4 Events: CSF return

## 2024-05-30 LAB — CBC
HCT: 31.2 % — ABNORMAL LOW (ref 36.0–46.0)
Hemoglobin: 10.8 g/dL — ABNORMAL LOW (ref 12.0–15.0)
MCH: 31.4 pg (ref 26.0–34.0)
MCHC: 34.6 g/dL (ref 30.0–36.0)
MCV: 90.7 fL (ref 80.0–100.0)
Platelets: 254 K/uL (ref 150–400)
RBC: 3.44 MIL/uL — ABNORMAL LOW (ref 3.87–5.11)
RDW: 12.4 % (ref 11.5–15.5)
WBC: 13.9 K/uL — ABNORMAL HIGH (ref 4.0–10.5)
nRBC: 0 % (ref 0.0–0.2)

## 2024-05-30 NOTE — Progress Notes (Signed)
 Subjective: Postpartum Day 1: Cesarean Delivery scheduled RCS Patient reports tolerating PO, + flatus, and no problems voiding.    Objective: Vital signs in last 24 hours: Temp:  [97.4 F (36.3 C)-98.6 F (37 C)] 98.6 F (37 C) (12/30 0556) Pulse Rate:  [54-70] 60 (12/30 0556) Resp:  [15-19] 18 (12/30 0556) BP: (111-135)/(65-84) 118/76 (12/30 0556) SpO2:  [97 %-100 %] 99 % (12/30 0556)  Physical Exam:  General: alert and cooperative Lochia: appropriate Uterine Fundus: firm Incision: healing well DVT Evaluation: No evidence of DVT seen on physical exam.  Recent Labs    05/30/24 0500  HGB 10.8*  HCT 31.2*    Assessment/Plan: Status post Cesarean section. Doing well postoperatively.  Continue current care. Declines inpt circ.    Kelly Delon Milian, MD 05/30/2024, 9:30 AM

## 2024-05-31 MED ORDER — OXYCODONE HCL 5 MG PO TABS: 5.0000 mg | ORAL_TABLET | ORAL | 0 refills | Status: AC | PRN

## 2024-05-31 MED ORDER — IBUPROFEN 600 MG PO TABS: 600.0000 mg | ORAL_TABLET | Freq: Four times a day (QID) | ORAL | 0 refills | Status: AC | PRN

## 2024-05-31 MED ORDER — DOCUSATE SODIUM 100 MG PO CAPS: 100.0000 mg | ORAL_CAPSULE | Freq: Two times a day (BID) | ORAL | 2 refills | Status: AC

## 2024-05-31 NOTE — Discharge Summary (Signed)
 "    Postpartum Discharge Summary  Date of Service updated 05/31/24     Patient Name: Victoria Mcintosh DOB: 21-Oct-1980 MRN: [id number]  Date of admission: 05/29/2024 Delivery date:05/29/2024 Delivering provider: LAURENCE SLATER PARAS Date of discharge: 05/31/2024  Admitting diagnosis: History of cesarean delivery [Z98.891] S/P repeat low transverse C-section [Z98.891] Intrauterine pregnancy: [redacted]w[redacted]d     Secondary diagnosis:  Principal Problem:   S/P repeat low transverse C-section  Additional problems: None    Discharge diagnosis: Term Pregnancy Delivered                                              Post partum procedures:None Augmentation: N/A Complications: None  Hospital course: Sceduled C/S   43 y.o. yo G3P3003 at [redacted]w[redacted]d was admitted to the hospital 05/29/2024 for scheduled cesarean section with the following indication:Elective Repeat.Delivery details are as follows:  Membrane Rupture Time/Date: 10:44 AM,05/29/2024  Delivery Method:C-Section, Low Transverse Operative Delivery:N/A Details of operation can be found in separate operative note.  Patient had a postpartum course complicated bynone.  She is ambulating, tolerating a regular diet, passing flatus, and urinating well. Patient is discharged home in stable condition on  05/31/2024        Newborn Data: Birth date:05/29/2024 Birth time:10:44 AM Gender:Female Living status:Living Apgars:9 ,9  Weight:3240 g    Magnesium Sulfate received: No BMZ received: No Rhophylac:N/A  Immunizations administered: Immunization History  Administered Date(s) Administered   Tdap 09/23/2020    Physical exam  Vitals:   05/30/24 0556 05/30/24 1304 05/30/24 2041 05/31/24 0444  BP: 118/76 103/64 115/62 136/77  Pulse: 60 65 67 64  Resp: 18 16 16 18   Temp: 98.6 F (37 C) 97.6 F (36.4 C) 98.1 F (36.7 C) 98.7 F (37.1 C)  TempSrc: Oral Oral Oral   SpO2: 99% 98% 99% 98%  Weight:      Height:       General: alert, cooperative, and no  distress Lochia: appropriate Uterine Fundus: firm Incision: Healing well with no significant drainage DVT Evaluation: No evidence of DVT seen on physical exam. Labs: Lab Results  Component Value Date   WBC 13.9 (H) 05/30/2024   HGB 10.8 (L) 05/30/2024   HCT 31.2 (L) 05/30/2024   MCV 90.7 05/30/2024   PLT 254 05/30/2024      Latest Ref Rng & Units 03/22/2023    1:59 PM  CMP  Glucose 70 - 99 mg/dL 72   BUN 6 - 23 mg/dL 11   Creatinine 9.59 - 1.20 mg/dL 9.24   Sodium 864 - 854 mEq/L 138   Potassium 3.5 - 5.1 mEq/L 4.5   Chloride 96 - 112 mEq/L 101   CO2 19 - 32 mEq/L 27   Calcium 8.4 - 10.5 mg/dL 9.6   Total Protein 6.0 - 8.3 g/dL 7.4   Total Bilirubin 0.2 - 1.2 mg/dL 0.5   Alkaline Phos 39 - 117 U/L 35   AST 0 - 37 U/L 21   ALT 0 - 35 U/L 14    Edinburgh Score:     No data to display            After visit meds:  Allergies as of 05/31/2024   No Known Allergies      Medication List     STOP taking these medications    VITAMIN C PO  TAKE these medications    docusate sodium  100 MG capsule Commonly known as: Colace Take 1 capsule (100 mg total) by mouth 2 (two) times daily.   ibuprofen  600 MG tablet Commonly known as: ADVIL  Take 1 tablet (600 mg total) by mouth every 6 (six) hours as needed.   oxyCODONE  5 MG immediate release tablet Commonly known as: Oxy IR/ROXICODONE  Take 1 tablet (5 mg total) by mouth every 4 (four) hours as needed for severe pain (pain score 7-10).   prenatal vitamin w/FE, FA 29-1 MG Chew chewable tablet Chew 1 tablet by mouth daily at 12 noon. What changed: when to take this         Discharge home in stable condition Infant Feeding: Bottle and Breast Infant Disposition:home with mother Discharge instruction: per After Visit Summary and Postpartum booklet. Activity: Advance as tolerated. Pelvic rest for 6 weeks.  Diet: routine diet Anticipated Birth Control: Unsure Postpartum Appointment:6 weeks Additional  Postpartum F/U: None Future Appointments:No future appointments. Follow up Visit:      05/31/2024 Kelly Delon Milian, MD   "

## 2024-05-31 NOTE — Lactation Note (Signed)
 This note was copied from a baby's chart. Lactation Consultation Note  Patient Name: Victoria Mcintosh Unijb'd Date: 05/31/2024 Age:44 hours, Mom requested LC visit this am.   It been 14 years since her last baby and mom is experienced BF .  Per mom baby hasn't fed since 4:15 am 15 mins.  LC reviewed breast feeding goals for 24 hours - feed with cues and by 3 hours offer the breast STS. Especially due to weight loss of 9 %.  LC checked the diaper and it was dry. Last wet at 0447, and Last stool  Last evening 2051.  LC reviewed the doc flow sheets with parents and confirmed the output.  Per mom the baby hasn't latching as well on the left breast compared to the right.  LC offered to assist and  mom receptive. LC positioned the baby STS on the left breast in the football position and worked on latching deeply, Latch score 8.  LC recommended due to  9 % weight loss - steps for latching to enhance the flow and then latched with firm support,  LC stressed the importance when the baby is latched and feeding he needs to stay in and intermittent consistent pattern for at least 15 - 20 mins with swallows.  If baby is still hungry after the 1st breast offer the 2nd breast and post pump both breast for 15 mins , save the milk for the next feeding for supplementing.  Once the weight loss is less and milk is coming well can decrease pumping.  LC reviewed engorgement prevention and tx .  See below for the Breast feeding tools provided with instructions and the D/C plans.   Maternal Data Has patient been taught Hand Expression?: Yes (LC reviewed and small drops noted, more after feeding) Does the patient have breastfeeding experience prior to this delivery?: Yes  Feeding Mother's Current Feeding Choice: Breast Milk  LATCH Score Latch: Repeated attempts needed to sustain latch, nipple held in mouth throughout feeding, stimulation needed to elicit sucking reflex.  Audible Swallowing: Spontaneous and  intermittent  Type of Nipple: Everted at rest and after stimulation  Comfort (Breast/Nipple): Soft / non-tender  Hold (Positioning): Assistance needed to correctly position infant at breast and maintain latch.  LATCH Score: 8   Lactation Tools Discussed/Used Tools: Shells;Pump;Flanges;Coconut oil Flange Size: 18;21 Breast pump type: Manual Pump Education: Setup, frequency, and cleaning;Milk Storage Reason for Pumping: LC recommended steps for latching to enhance milk flow and to assist baby to stay in a active consistent pattern.  Interventions Interventions: Breast feeding basics reviewed;Assisted with latch;Skin to skin;Breast massage;Hand express;Pre-pump if needed;Reverse pressure;Breast compression;Adjust position;Support pillows;Position options;Coconut oil;Shells;Hand pump;Education;LC Services brochure;CDC milk storage guidelines;CDC Guidelines for Breast Pump Cleaning  Discharge Discharge Education: Engorgement and breast care;Warning signs for feeding baby;Outpatient recommendation (LC offered to request a LC O/P and mom did not consent to it) Pump: DEBP;Personal;Manual (per mom has a Spectra  and has to order the new parts. LC also recommended Flange inserts)  Consult Status Consult Status: Complete Date: 05/31/24    Rollene Jenkins Fiedler 05/31/2024, 9:23 AM

## 2024-06-02 ENCOUNTER — Telehealth: Payer: Self-pay

## 2024-06-02 NOTE — Transitions of Care (Post Inpatient/ED Visit) (Signed)
" ° °  06/02/2024  Name: Victoria Mcintosh MRN: [id number] DOB: August 06, 1980  Today's TOC FU Call Status: Today's TOC FU Call Status:: Unsuccessful Call (1st Attempt) Unsuccessful Call (1st Attempt) Date: 06/02/24  Attempted to reach the patient regarding the most recent Inpatient/ED visit.  Follow Up Plan: Additional outreach attempts will be made to reach the patient to complete the Transitions of Care (Post Inpatient/ED visit) call.   Signature Ardella Dawn LPN [facility] AWV Team Direct dial:  [phone]  "

## 2024-06-05 NOTE — Transitions of Care (Post Inpatient/ED Visit) (Signed)
" ° °  06/05/2024  Name: Victoria Mcintosh MRN: [id number] DOB: Oct 09, 1980  Today's TOC FU Call Status: Today's TOC FU Call Status:: Unsuccessful Call (1st Attempt) Unsuccessful Call (1st Attempt) Date: 06/02/24  Attempted to reach the patient regarding the most recent Inpatient/ED visit.  Follow Up Plan: No further outreach attempts will be made at this time. We have been unable to contact the patient. C-section Signature Julian Lemmings, LPN [facility] Nurse Health Advisor Direct Dial [phone]  "

## 2024-06-07 ENCOUNTER — Telehealth (HOSPITAL_COMMUNITY): Payer: Self-pay | Admitting: *Deleted

## 2024-06-07 NOTE — Telephone Encounter (Signed)
 06/07/2024  Name: Victoria Mcintosh MRN: [id number] DOB: [birth year]  Reason for Call:  Transition of Care Hospital Discharge Call  Contact Status: Patient Contact Status: Message  Language assistant needed:          Follow-Up Questions:    Van Postnatal Depression Scale:  In the Past 7 Days:    PHQ2-9 Depression Scale:     Discharge Follow-up:    Post-discharge interventions: NA  Mliss Sieve, RN 06/07/2024 15:29
